# Patient Record
Sex: Female | Born: 1935 | Race: White | Hispanic: No | Marital: Married | Smoking: Never smoker
Health system: Southern US, Community
[De-identification: ages and names within clinical notes are randomized; demographics above are authoritative.]

## PROBLEM LIST (undated history)

## (undated) DIAGNOSIS — Z860100 Personal history of colon polyps, unspecified: Secondary | ICD-10-CM

## (undated) DIAGNOSIS — N189 Chronic kidney disease, unspecified: Secondary | ICD-10-CM

## (undated) DIAGNOSIS — I499 Cardiac arrhythmia, unspecified: Secondary | ICD-10-CM

## (undated) DIAGNOSIS — E785 Hyperlipidemia, unspecified: Secondary | ICD-10-CM

## (undated) DIAGNOSIS — T7840XA Allergy, unspecified, initial encounter: Secondary | ICD-10-CM

## (undated) DIAGNOSIS — K219 Gastro-esophageal reflux disease without esophagitis: Secondary | ICD-10-CM

## (undated) DIAGNOSIS — F0390 Unspecified dementia without behavioral disturbance: Secondary | ICD-10-CM

## (undated) DIAGNOSIS — IMO0002 Reserved for concepts with insufficient information to code with codable children: Secondary | ICD-10-CM

## (undated) HISTORY — DX: Personal history of colonic polyps: Z86.010

## (undated) HISTORY — DX: Allergy, unspecified, initial encounter: T78.40XA

## (undated) HISTORY — PX: CHOLECYSTECTOMY: SHX55

## (undated) HISTORY — DX: Reserved for concepts with insufficient information to code with codable children: IMO0002

## (undated) HISTORY — DX: Hyperlipidemia, unspecified: E78.5

## (undated) HISTORY — DX: Chronic kidney disease, unspecified: N18.9

## (undated) HISTORY — PX: CATARACT EXTRACTION: SUR2

## (undated) HISTORY — DX: Cardiac arrhythmia, unspecified: I49.9

## (undated) HISTORY — DX: Personal history of colon polyps, unspecified: Z86.0100

## (undated) HISTORY — PX: TONSILLECTOMY: SUR1361

## (undated) HISTORY — DX: Gastro-esophageal reflux disease without esophagitis: K21.9

---

## 2001-07-18 ENCOUNTER — Ambulatory Visit (HOSPITAL_COMMUNITY): Admission: RE | Admit: 2001-07-18 | Discharge: 2001-07-18 | Payer: Self-pay | Admitting: Gastroenterology

## 2001-07-18 ENCOUNTER — Encounter (INDEPENDENT_AMBULATORY_CARE_PROVIDER_SITE_OTHER): Payer: Self-pay | Admitting: Specialist

## 2001-09-10 ENCOUNTER — Ambulatory Visit (HOSPITAL_COMMUNITY): Admission: RE | Admit: 2001-09-10 | Discharge: 2001-09-10 | Payer: Self-pay | Admitting: Gastroenterology

## 2004-02-23 ENCOUNTER — Ambulatory Visit: Payer: Self-pay | Admitting: Family Medicine

## 2004-03-01 ENCOUNTER — Ambulatory Visit: Payer: Self-pay | Admitting: Family Medicine

## 2004-05-11 ENCOUNTER — Encounter: Admission: RE | Admit: 2004-05-11 | Discharge: 2004-05-11 | Payer: Self-pay | Admitting: Neurology

## 2004-10-06 ENCOUNTER — Emergency Department (HOSPITAL_COMMUNITY): Admission: EM | Admit: 2004-10-06 | Discharge: 2004-10-06 | Payer: Self-pay | Admitting: Emergency Medicine

## 2005-03-09 ENCOUNTER — Encounter: Admission: RE | Admit: 2005-03-09 | Discharge: 2005-04-10 | Payer: Self-pay | Admitting: Neurology

## 2005-04-11 ENCOUNTER — Encounter: Admission: RE | Admit: 2005-04-11 | Discharge: 2005-07-10 | Payer: Self-pay | Admitting: Neurology

## 2006-03-05 ENCOUNTER — Emergency Department (HOSPITAL_COMMUNITY): Admission: EM | Admit: 2006-03-05 | Discharge: 2006-03-05 | Payer: Self-pay | Admitting: Emergency Medicine

## 2006-03-14 ENCOUNTER — Encounter: Admission: RE | Admit: 2006-03-14 | Discharge: 2006-03-14 | Payer: Self-pay | Admitting: Gastroenterology

## 2006-05-07 ENCOUNTER — Ambulatory Visit (HOSPITAL_COMMUNITY): Admission: RE | Admit: 2006-05-07 | Discharge: 2006-05-08 | Payer: Self-pay | Admitting: Surgery

## 2006-05-07 ENCOUNTER — Encounter (INDEPENDENT_AMBULATORY_CARE_PROVIDER_SITE_OTHER): Payer: Self-pay | Admitting: Specialist

## 2006-11-27 DIAGNOSIS — K219 Gastro-esophageal reflux disease without esophagitis: Secondary | ICD-10-CM | POA: Insufficient documentation

## 2006-11-27 DIAGNOSIS — Z860100 Personal history of colon polyps, unspecified: Secondary | ICD-10-CM | POA: Insufficient documentation

## 2006-11-27 DIAGNOSIS — E785 Hyperlipidemia, unspecified: Secondary | ICD-10-CM

## 2007-10-29 ENCOUNTER — Ambulatory Visit: Payer: Self-pay | Admitting: Family Medicine

## 2007-10-29 ENCOUNTER — Emergency Department (HOSPITAL_COMMUNITY): Admission: EM | Admit: 2007-10-29 | Discharge: 2007-10-29 | Payer: Self-pay | Admitting: Emergency Medicine

## 2007-10-29 DIAGNOSIS — E039 Hypothyroidism, unspecified: Secondary | ICD-10-CM | POA: Insufficient documentation

## 2007-10-29 DIAGNOSIS — G309 Alzheimer's disease, unspecified: Secondary | ICD-10-CM

## 2007-10-29 DIAGNOSIS — F411 Generalized anxiety disorder: Secondary | ICD-10-CM | POA: Insufficient documentation

## 2007-10-29 DIAGNOSIS — F0280 Dementia in other diseases classified elsewhere without behavioral disturbance: Secondary | ICD-10-CM

## 2007-10-30 ENCOUNTER — Encounter: Payer: Self-pay | Admitting: Family Medicine

## 2007-11-25 ENCOUNTER — Ambulatory Visit: Payer: Self-pay | Admitting: Family Medicine

## 2007-11-28 ENCOUNTER — Ambulatory Visit: Payer: Self-pay | Admitting: Family Medicine

## 2007-11-28 DIAGNOSIS — J309 Allergic rhinitis, unspecified: Secondary | ICD-10-CM | POA: Insufficient documentation

## 2007-11-28 LAB — CONVERTED CEMR LAB: Hgb A1c MFr Bld: 7.6 % — ABNORMAL HIGH (ref 4.6–6.0)

## 2008-03-04 ENCOUNTER — Ambulatory Visit: Payer: Self-pay | Admitting: Family Medicine

## 2008-03-08 LAB — CONVERTED CEMR LAB: Hgb A1c MFr Bld: 7.8 % — ABNORMAL HIGH (ref 4.6–6.0)

## 2008-03-11 ENCOUNTER — Ambulatory Visit: Payer: Self-pay | Admitting: Family Medicine

## 2008-07-02 ENCOUNTER — Telehealth: Payer: Self-pay | Admitting: Family Medicine

## 2008-08-12 ENCOUNTER — Emergency Department (HOSPITAL_COMMUNITY): Admission: EM | Admit: 2008-08-12 | Discharge: 2008-08-13 | Payer: Self-pay | Admitting: Emergency Medicine

## 2008-08-19 ENCOUNTER — Ambulatory Visit: Payer: Self-pay | Admitting: Family Medicine

## 2008-08-19 DIAGNOSIS — E119 Type 2 diabetes mellitus without complications: Secondary | ICD-10-CM | POA: Insufficient documentation

## 2008-08-29 ENCOUNTER — Emergency Department (HOSPITAL_COMMUNITY): Admission: EM | Admit: 2008-08-29 | Discharge: 2008-08-29 | Payer: Self-pay | Admitting: Emergency Medicine

## 2008-09-04 LAB — CONVERTED CEMR LAB
ALT: 14 units/L (ref 0–35)
AST: 23 units/L (ref 0–37)
Albumin: 3.8 g/dL (ref 3.5–5.2)
BUN: 19 mg/dL (ref 6–23)
Basophils Relative: 2.8 % (ref 0.0–3.0)
CO2: 28 meq/L (ref 19–32)
Chloride: 108 meq/L (ref 96–112)
Cholesterol: 139 mg/dL (ref 0–200)
Creatinine, Ser: 0.9 mg/dL (ref 0.4–1.2)
Eosinophils Absolute: 0.1 10*3/uL (ref 0.0–0.7)
Eosinophils Relative: 2.6 % (ref 0.0–5.0)
HCT: 36.8 % (ref 36.0–46.0)
Hgb A1c MFr Bld: 8.8 % — ABNORMAL HIGH (ref 4.6–6.5)
Lymphs Abs: 1.7 10*3/uL (ref 0.7–4.0)
MCHC: 34.3 g/dL (ref 30.0–36.0)
MCV: 98.1 fL (ref 78.0–100.0)
Monocytes Absolute: 0.4 10*3/uL (ref 0.1–1.0)
Neutrophils Relative %: 52 % (ref 43.0–77.0)
Platelets: 241 10*3/uL (ref 150.0–400.0)
Potassium: 4 meq/L (ref 3.5–5.1)
Total Bilirubin: 0.5 mg/dL (ref 0.3–1.2)
Triglycerides: 94 mg/dL (ref 0.0–149.0)

## 2008-09-22 ENCOUNTER — Observation Stay (HOSPITAL_COMMUNITY): Admission: EM | Admit: 2008-09-22 | Discharge: 2008-09-22 | Payer: Self-pay | Admitting: Emergency Medicine

## 2008-10-30 ENCOUNTER — Emergency Department (HOSPITAL_COMMUNITY): Admission: EM | Admit: 2008-10-30 | Discharge: 2008-10-30 | Payer: Self-pay | Admitting: Emergency Medicine

## 2008-12-01 ENCOUNTER — Ambulatory Visit: Payer: Self-pay | Admitting: Family Medicine

## 2008-12-12 LAB — CONVERTED CEMR LAB: Hgb A1c MFr Bld: 10 % — ABNORMAL HIGH (ref 4.6–6.5)

## 2009-02-24 ENCOUNTER — Ambulatory Visit: Payer: Self-pay | Admitting: Family Medicine

## 2009-02-24 DIAGNOSIS — D649 Anemia, unspecified: Secondary | ICD-10-CM | POA: Insufficient documentation

## 2009-03-02 LAB — CONVERTED CEMR LAB
BUN: 12 mg/dL (ref 6–23)
Basophils Absolute: 0 10*3/uL (ref 0.0–0.1)
Calcium: 9.9 mg/dL (ref 8.4–10.5)
Chloride: 107 meq/L (ref 96–112)
Cholesterol: 149 mg/dL (ref 0–200)
Creatinine, Ser: 0.9 mg/dL (ref 0.4–1.2)
GFR calc non Af Amer: 63.38 mL/min (ref >60)
LDL Cholesterol: 60 mg/dL (ref 0–99)
Lymphocytes Relative: 39.3 % (ref 12.0–46.0)
Monocytes Relative: 7 % (ref 3.0–12.0)
Platelets: 241 10*3/uL (ref 150.0–400.0)
RDW: 12.8 % (ref 11.5–14.6)
TSH: 1.29 microintl units/mL (ref 0.35–5.50)
Total CHOL/HDL Ratio: 2
Triglycerides: 94 mg/dL (ref 0.0–149.0)

## 2009-06-22 ENCOUNTER — Telehealth: Payer: Self-pay | Admitting: Family Medicine

## 2009-07-07 ENCOUNTER — Encounter: Payer: Self-pay | Admitting: Family Medicine

## 2009-08-31 ENCOUNTER — Telehealth: Payer: Self-pay | Admitting: Family Medicine

## 2009-09-28 ENCOUNTER — Emergency Department (HOSPITAL_COMMUNITY)
Admission: EM | Admit: 2009-09-28 | Discharge: 2009-09-28 | Payer: Self-pay | Source: Home / Self Care | Admitting: Emergency Medicine

## 2009-10-07 ENCOUNTER — Ambulatory Visit: Payer: Self-pay | Admitting: Family Medicine

## 2009-10-11 LAB — CONVERTED CEMR LAB: Hgb A1c MFr Bld: 11.3 % — ABNORMAL HIGH (ref 4.6–6.5)

## 2010-01-06 ENCOUNTER — Emergency Department (HOSPITAL_COMMUNITY)
Admission: EM | Admit: 2010-01-06 | Discharge: 2010-01-07 | Payer: Self-pay | Source: Home / Self Care | Admitting: Emergency Medicine

## 2010-01-14 ENCOUNTER — Telehealth: Payer: Self-pay | Admitting: Family Medicine

## 2010-01-26 ENCOUNTER — Ambulatory Visit: Payer: Self-pay | Admitting: Family Medicine

## 2010-03-29 ENCOUNTER — Telehealth: Payer: Self-pay | Admitting: Family Medicine

## 2010-05-03 ENCOUNTER — Ambulatory Visit
Admission: RE | Admit: 2010-05-03 | Discharge: 2010-05-03 | Payer: Self-pay | Source: Home / Self Care | Attending: Family Medicine | Admitting: Family Medicine

## 2010-05-03 ENCOUNTER — Encounter: Payer: Self-pay | Admitting: Family Medicine

## 2010-05-03 ENCOUNTER — Other Ambulatory Visit: Payer: Self-pay | Admitting: Family Medicine

## 2010-05-03 DIAGNOSIS — E559 Vitamin D deficiency, unspecified: Secondary | ICD-10-CM | POA: Insufficient documentation

## 2010-05-03 LAB — BASIC METABOLIC PANEL
Chloride: 106 mEq/L (ref 96–112)
GFR: 49.61 mL/min — ABNORMAL LOW (ref >60.00)
Potassium: 5.1 mEq/L (ref 3.5–5.1)
Sodium: 147 mEq/L — ABNORMAL HIGH (ref 135–145)

## 2010-05-03 LAB — CONVERTED CEMR LAB
Bilirubin Urine: NEGATIVE
Blood in Urine, dipstick: NEGATIVE
Glucose, Urine, Semiquant: 1
Ketones, urine, test strip: NEGATIVE
Nitrite: NEGATIVE
Protein, U semiquant: NEGATIVE
Specific Gravity, Urine: 1.015
Urobilinogen, UA: 0.2
WBC Urine, dipstick: NEGATIVE
pH: 5.5

## 2010-05-03 LAB — CBC WITH DIFFERENTIAL/PLATELET
Basophils Absolute: 0 10*3/uL (ref 0.0–0.1)
Basophils Relative: 0.7 % (ref 0.0–3.0)
Eosinophils Absolute: 0.2 10*3/uL (ref 0.0–0.7)
Eosinophils Relative: 2.8 % (ref 0.0–5.0)
HCT: 39.9 % (ref 36.0–46.0)
Hemoglobin: 13.6 g/dL (ref 12.0–15.0)
Lymphocytes Relative: 27.4 % (ref 12.0–46.0)
Lymphs Abs: 1.9 10*3/uL (ref 0.7–4.0)
MCHC: 34.1 g/dL (ref 30.0–36.0)
MCV: 100.1 fl — ABNORMAL HIGH (ref 78.0–100.0)
Monocytes Absolute: 0.5 10*3/uL (ref 0.1–1.0)
Monocytes Relative: 6.9 % (ref 3.0–12.0)
Neutro Abs: 4.3 10*3/uL (ref 1.4–7.7)
Neutrophils Relative %: 62.2 % (ref 43.0–77.0)
Platelets: 270 10*3/uL (ref 150.0–400.0)
RBC: 3.99 Mil/uL (ref 3.87–5.11)
RDW: 14.1 % (ref 11.5–14.6)
WBC: 6.9 10*3/uL (ref 4.5–10.5)

## 2010-05-03 LAB — HEPATIC FUNCTION PANEL
ALT: 17 U/L (ref 0–35)
AST: 26 U/L (ref 0–37)
Albumin: 4.1 g/dL (ref 3.5–5.2)
Total Protein: 7.8 g/dL (ref 6.0–8.3)

## 2010-05-03 LAB — MICROALBUMIN / CREATININE URINE RATIO
Creatinine,U: 99.4 mg/dL
Microalb Creat Ratio: 1.1 mg/g (ref 0.0–30.0)

## 2010-05-03 LAB — HEMOGLOBIN A1C: Hgb A1c MFr Bld: 8 % — ABNORMAL HIGH (ref 4.6–6.5)

## 2010-05-03 LAB — LIPID PANEL
Cholesterol: 189 mg/dL (ref 0–200)
VLDL: 29.6 mg/dL (ref 0.0–40.0)

## 2010-05-03 LAB — TSH: TSH: 3.73 u[IU]/mL (ref 0.35–5.50)

## 2010-05-04 LAB — CONVERTED CEMR LAB: Vit D, 25-Hydroxy: 11 ng/mL — ABNORMAL LOW (ref 30–89)

## 2010-05-12 ENCOUNTER — Telehealth: Payer: Self-pay | Admitting: Family Medicine

## 2010-05-12 NOTE — Progress Notes (Signed)
Summary: new rx needed  tranxene  Phone Note From Pharmacy   [facility] Summary of Call: Needs a new rx for Tranxene 7.5mg   three times a day. call [phone].  Initial call taken by: Warnell Forester,  June 22, 2009 12:19 PM  Follow-up for Phone Call        ok x 3  Follow-up by: Pura Spice, RN,  June 22, 2009 12:47 PM    New/Updated Medications: CLORAZEPATE DIPOTASSIUM 7.5 MG  TABS (CLORAZEPATE DIPOTASSIUM) by mouth three times a day Prescriptions: CLORAZEPATE DIPOTASSIUM 7.5 MG  TABS (CLORAZEPATE DIPOTASSIUM) by mouth three times a day  #90 x 3   Entered by:   Pura Spice, RN   Authorized by:   Judithann Sheen MD   Signed by:   Pura Spice, RN on 06/22/2009   Method used:   Telephoned to ...       [facility]  [address] [phone][facility] (retail)       [address]       [place], [place]  [address]       Ph: [phone] or [phone]       Fax: [phone]   RxID:   [id number]

## 2010-05-12 NOTE — Assessment & Plan Note (Signed)
Summary: FOLLOW ON DIABETES/AND HOSP FU/PT FASTING/PER GINA/CJR   Vital Signs:  Patient profile:   77 year old female Weight:      123 pounds O2 Sat:      96 % Temp:     97.6 degrees F Pulse rate:   77 / minute Pulse rhythm:   regular BP sitting:   112 / 80  (left arm)  Vitals Entered By: Pura Spice, RN (October 07, 2009 9:15 AM) CC: post hosp f/u uncontrolled diabetes. spouse states not been taking meds right  FBS this Am 105 Is Patient Diabetic? Yes Did you bring your meter with you today? No   History of Present Illness: This 77 year old white female patient with Alzheimer's and known diabetes mellitus2 has been to the emergency room recently because of hyperglycemia but after talking to her husband found that she has not been taking her medicines is in today to evaluate the status of her diabetes and make a plan to prevent this fluctuation of blood sugars Patient actually is she is okay your seeing her husband go out to eat frequently at [facility] make a plan of treatment better after getting lab studies  Allergies (verified): No Known Drug Allergies  Past History:  Past Medical History: Last updated: 11/27/2006 Allergies PVC's Hx kidney stones GERD Hyperlipidemia Ulcers Allergies Heart Arrhythmia High Cholesterol Colonic polyps, hx of Bladder Infections  Past Surgical History: Last updated: 11/27/2006 Tonsillectomy  Social History: Last updated: 11/27/2006 Retired Married Never Smoked Alcohol use-no Drug use-no  Risk Factors: Smoking Status: never (11/27/2006)  Review of Systems      See HPI  The patient denies anorexia, fever, weight loss, weight gain, vision loss, decreased hearing, hoarseness, chest pain, syncope, dyspnea on exertion, peripheral edema, prolonged cough, headaches, hemoptysis, abdominal pain, melena, hematochezia, severe indigestion/heartburn, hematuria, incontinence, genital sores, muscle weakness, suspicious skin lesions,  transient blindness, difficulty walking, depression, unusual weight change, abnormal bleeding, enlarged lymph nodes, angioedema, breast masses, and testicular masses.    Physical Exam  General:  Well-developed,well-nourished,in no acute distress; alert,appropriate and cooperative throughout examination Lungs:  Normal respiratory effort, chest expands symmetrically. Lungs are clear to auscultation, no crackles or wheezes. Heart:  Normal rate and regular rhythm. S1 and S2 normal without gallop, murmur, click, rub or other extra sounds. Extremities:  No clubbing, cyanosis, edema, or deformity noted with normal full range of motion of all joints.     Impression & Recommendations:  Problem # 1:  DIABETES MELLITUS, TYPE II (ICD-250.00) Assessment Deteriorated  Her updated medication list for this problem includes:    Adult Aspirin Low Strength 81 Mg Tbdp (Aspirin)    Metformin Hcl 1000 Mg Tabs (Metformin hcl) .Marland Kitchen... 1  qd    Actos 45 Mg Tabs (Pioglitazone hcl) .Marland Kitchen... 1 qd    Glyburide 5 Mg Tabs (Glyburide) .Marland Kitchen... 1 tab  per day  Orders: Venipuncture ([address]) TLB-A1C / Hgb A1C (Glycohemoglobin) (83036-A1C)  Problem # 2:  ALZHEIMER'S DISEASE (ICD-331.0) Assessment: Unchanged  Exelon 4.6 mg patch daily  Problem # 3:  GERD (ICD-530.81) Assessment: Improved  Her updated medication list for this problem includes:    Prilosec 20 Mg Cpdr (Omeprazole) ..... Once two times a day for gerd  Problem # 4:  HYPOTHYROIDISM (ICD-244.9) Assessment: Improved  Her updated medication list for this problem includes:    Levoxyl 75 Mcg Tabs (Levothyroxine sodium) ..... By mouth once daily  Problem # 5:  ANXIETY, CHRONIC (ICD-300.00) Assessment: Improved  Her updated medication list for  this problem includes:    Clorazepate Dipotassium 7.5 Mg Tabs (Clorazepate dipotassium) ..... By mouth three times a day    Sertraline Hcl 50 Mg Tabs (Sertraline hcl) .Marland Kitchen... 1 by mouth once daily  Complete Medication  List: 1)  Adult Aspirin Low Strength 81 Mg Tbdp (Aspirin) 2)  Prilosec 20 Mg Cpdr (Omeprazole) .... Once two times a day for gerd 3)  Levoxyl 75 Mcg Tabs (Levothyroxine sodium) .... By mouth once daily 4)  Clorazepate Dipotassium 7.5 Mg Tabs (Clorazepate dipotassium) .... By mouth three times a day 5)  Sertraline Hcl 50 Mg Tabs (Sertraline hcl) .Marland Kitchen.. 1 by mouth once daily 6)  Exelon 4.6 Mg/24hr Pt24 (Rivastigmine) .Marland Kitchen.. 1 patch each day 7)  Metformin Hcl 1000 Mg Tabs (Metformin hcl) .Marland Kitchen.. 1  qd 8)  Simvastatin 40 Mg Tabs (Simvastatin) .Marland Kitchen.. 1 hs 9)  Actos 45 Mg Tabs (Pioglitazone hcl) .Marland Kitchen.. 1 qd 10)  Glyburide 5 Mg Tabs (Glyburide) .Marland Kitchen.. 1 tab  per day  Patient Instructions: 1)  continue regular medications at this time and after obtaining laboratory studies we will decide on medications

## 2010-05-12 NOTE — Progress Notes (Signed)
Summary: rex clorazepate 7.5 mg with 5 refills   Phone Note From Pharmacy   Caller: [facility]  Reason for Call: Needs renewal Summary of Call: refill clorazepate 7.5 mg  Initial call taken by: Pura Spice, RN,  Aug 31, 2009 1:03 PM  Follow-up for Phone Call        ok'd per dr Scotty Court and faxed to [facility]  Follow-up by: Pura Spice, RN,  Aug 31, 2009 1:04 PM    Prescriptions: CLORAZEPATE DIPOTASSIUM 7.5 MG  TABS (CLORAZEPATE DIPOTASSIUM) by mouth three times a day  #90 x 5   Entered by:   Pura Spice, RN   Authorized by:   Judithann Sheen MD   Signed by:   Pura Spice, RN on 08/31/2009   Method used:   Printed then faxed to ...       [facility]  [address] [phone]* (retail)       [address]       [place], [place]  [address]       Ph: [phone] or [phone]       Fax: [phone]   RxID:   [id number]

## 2010-05-12 NOTE — Medication Information (Signed)
Summary: Order for Diabetic Supplies  Order for Diabetic Supplies   Imported By: Maryln Gottron 07/12/2009 11:04:46  _____________________________________________________________________  External Attachment:    Type:   Image     Comment:   External Document

## 2010-05-12 NOTE — Assessment & Plan Note (Signed)
Summary: flu shot//ccm  Nurse Visit   Allergies: No Known Drug Allergies  Orders Added: 1)  Flu Vaccine 55yrs + MEDICARE PATIENTS [Q2039] 2)  Administration Flu vaccine - MCR [G0008] Flu Vaccine Consent Questions     Do you have a history of severe allergic reactions to this vaccine? no    Any prior history of allergic reactions to egg and/or gelatin? no    Do you have a sensitivity to the preservative Thimersol? no    Do you have a past history of Guillan-Barre Syndrome? no    Do you currently have an acute febrile illness? no    Have you ever had a severe reaction to latex? no    Vaccine information given and explained to patient? yes    Are you currently pregnant? no    Lot Number:[id number]   Exp Date:10/08/2010   Site Given  Left Deltoid IM

## 2010-05-12 NOTE — Progress Notes (Signed)
Summary: request for sleep med  Phone Note Call from Patient   Caller: Patient Call For: Judithann Sheen MD Summary of Call: hus is requesting something for sleep to help pt, she has dementia, [facility] [address] [phone]. Pt has ov sch for 04-2010 Initial call taken by: Heron Sabins,  March 29, 2010 1:08 PM  Follow-up for Phone Call        rx called in. Follow-up by: Romualdo Bolk, CMA (AAMA),  April 01, 2010 11:25 AM    New/Updated Medications: TEMAZEPAM 15 MG CAPS (TEMAZEPAM) 1 by mouth at bedtime Prescriptions: TEMAZEPAM 15 MG CAPS (TEMAZEPAM) 1 by mouth at bedtime  #30 x 1   Entered by:   Romualdo Bolk, CMA (AAMA)   Authorized by:   Judithann Sheen MD   Signed by:   Romualdo Bolk, CMA (AAMA) on 04/01/2010   Method used:   Telephoned to ...       [facility]  [address] [phone]* (retail)       [address]       [place], [place]  [address]       Ph: [phone] or [phone]       Fax: [phone]   RxID:   [id number]

## 2010-05-12 NOTE — Telephone Encounter (Signed)
Pts husband called to adv that he was told to start giving his wife aricept but he doesn't know if he is supposed to stop giving his wife the exelon patch or should he give her both.... Would like a return call to [phone] to discuss same.

## 2010-05-12 NOTE — Progress Notes (Signed)
Summary: Glyburide refill  Phone Note Refill Request   Refills Requested: Medication #1:  GLYBURIDE 5 MG TABS 2 tabs per day.   Dosage confirmed as above?Dosage Confirmed Initial call taken by: Josph Macho RMA,  January 14, 2010 3:21 PM    Prescriptions: GLYBURIDE 5 MG TABS (GLYBURIDE) 2 tabs per day  #60 x 6   Entered by:   Josph Macho RMA   Authorized by:   Danise Edge MD   Signed by:   Josph Macho RMA on 01/14/2010   Method used:   Electronically to        [facility]* (mail-order)       [address]       [place], [place]  [address]       Ph: [phone]       Fax: [phone]   RxID:   [id number]

## 2010-05-12 NOTE — Assessment & Plan Note (Signed)
Summary: emp---will fast//ccm/pt rsc from bmp/cjr [facility] BMP/NJR   Vital Signs:  Patient profile:   77 year old female Height:      62 inches Weight:      126 pounds BMI:     23.13 Pulse rate:   82 / minute Pulse rhythm:   regular BP sitting:   120 / 68  (left arm)  Vitals Entered By: Kyung Rudd, CMA (May 03, 2010 8:47 AM) CC: CPX   History of Present Illness: This 77 yr old white married female with Alzheimers he brought in by her husband with her medical problems and discuss her refill. He relates she has been doing very well except for the Alzheimer which may be getting more severe there have discussed with him the possibility of adding Aricept 10 mg each night. Her diabetes is not been doing well CABG he relates has been to 100 or slightly under, to check hemoglobin A1 C. and then discuss further treatment record should mention the patient does not fall and Patient's husband states that Amorie does not sleep well at all and keep him white she does take temazepam 30 mg but is not effective She is taking Prilosec which is controlling her GERD As continued on regular medications  Current Medications (verified): 1)  Prilosec 20 Mg  Cpdr (Omeprazole) .... Once Two Times A Day For Gerd 2)  Levoxyl 75 Mcg  Tabs (Levothyroxine Sodium) .... By Mouth Once Daily 3)  Clorazepate Dipotassium 7.5 Mg  Tabs (Clorazepate Dipotassium) .... By Mouth Three Times A Day 4)  Sertraline Hcl 50 Mg  Tabs (Sertraline Hcl) .Marland Kitchen.. 1 By Mouth Once Daily 5)  Exelon 4.6 Mg/24hr  Pt24 (Rivastigmine) .Marland Kitchen.. 1 Patch Each Day 6)  Metformin Hcl 1000 Mg Tabs (Metformin Hcl) .Marland Kitchen.. 1  Qd 7)  Simvastatin 40 Mg Tabs (Simvastatin) .Marland Kitchen.. 1 Hs 8)  Actos 45 Mg Tabs (Pioglitazone Hcl) .Marland Kitchen.. 1 Qd 9)  Glyburide 5 Mg Tabs (Glyburide) .... 2 Tabs Per Day 10)  Temazepam 15 Mg Caps (Temazepam) .Marland Kitchen.. 1 By Mouth At Bedtime 11)  Aleve 220 Mg Tabs (Naproxen Sodium) .... Once Daily  Allergies (verified): No Known Drug Allergies  Past  History:  Past Medical History: Last updated: 11/27/2006 Allergies PVC's Hx kidney stones GERD Hyperlipidemia Ulcers Allergies Heart Arrhythmia High Cholesterol Colonic polyps, hx of Bladder Infections  Past Surgical History: Last updated: 11/27/2006 Tonsillectomy  Family History: Last updated: 11/27/2006 Family History Diabetes 1st degree relative Family History Kidney disease Family History of Sudden Death  Social History: Last updated: 11/27/2006 Retired Married Never Smoked Alcohol use-no Drug use-no  Social History: Reviewed history from 11/27/2006 and no changes required. Retired Married Never Smoked Alcohol use-no Drug use-no  Review of Systems      See HPI  Physical Exam  General:  Well-developed,well-nourished,in no acute distress; alert,appropriate and cooperative throughout examinationsmiles continuously and he is cooperative Head:  Normocephalic and atraumatic without obvious abnormalities. No apparent alopecia or balding. Eyes:  No corneal or conjunctival inflammation noted. EOMI. Perrla. Funduscopic exam benign, without hemorrhages, exudates or papilledema. Vision grossly normal. Ears:  External ear exam shows no significant lesions or deformities.  Otoscopic examination reveals clear canals, tympanic membranes are intact bilaterally without bulging, retraction, inflammation or discharge. Hearing is grossly normal bilaterally. Nose:  External nasal examination shows no deformity or inflammation. Nasal mucosa are pink and moist without lesions or exudates. Mouth:  Oral mucosa and oropharynx without lesions or exudates.  Teeth in good repair. Neck:  No  deformities, masses, or tenderness noted. Chest Wall:  No deformities, masses, or tenderness noted. Breasts:  No mass, nodules, thickening, tenderness, bulging, retraction, inflamation, nipple discharge or skin changes noted.   Lungs:  Normal respiratory effort, chest expands symmetrically. Lungs are  clear to auscultation, no crackles or wheezes. Heart:  Normal rate and regular rhythm. S1 and S2 normal without gallop, murmur, click, rub or other extra sounds. Abdomen:  Bowel sounds positive,abdomen soft and non-tender without masses, organomegaly or hernias noted. Rectal:  none exam Genitalia:  none exam Msk:  No deformity or scoliosis noted of thoracic or lumbar spine.   Pulses:  R and L carotid,radial,femoral,dorsalis pedis and posterior tibial pulses are full and equal bilaterally Extremities:  No clubbing, cyanosis, edema, or deformity noted with normal full range of motion of all joints.   Neurologic:  No cranial nerve deficits noted. Station and gait are normal. Plantar reflexes are down-going bilaterally. DTRs are symmetrical throughout. Sensory, motor and coordinative functions appear intact. Skin:  Intact without suspicious lesions or rashes Cervical Nodes:  No lymphadenopathy noted Axillary Nodes:  No palpable lymphadenopathy Inguinal Nodes:  No significant adenopathy Psych:  Cognition and judgment appear intact. Alert and cooperative with normal attention span and concentration. No apparent delusions, illusions, hallucinations   Impression & Recommendations:  Problem # 1:  DIABETES MELLITUS, TYPE II (ICD-250.00) Assessment Deteriorated  The following medications were removed from the medication list:    Adult Aspirin Low Strength 81 Mg Tbdp (Aspirin) Her updated medication list for this problem includes:    Metformin Hcl 1000 Mg Tabs (Metformin hcl) .Marland Kitchen... 1  qd    Actos 45 Mg Tabs (Pioglitazone hcl) .Marland Kitchen... 1 qd    Glyburide 5 Mg Tabs (Glyburide) .Marland Kitchen... 2 tabs per day  Orders: Venipuncture (16109) TLB-BMP (Basic Metabolic Panel-BMET) (80048-METABOL) TLB-Microalbumin/Creat Ratio, Urine (82043-MALB) TLB-A1C / Hgb A1C (Glycohemoglobin) (83036-A1C) Specimen Handling ([address])  Problem # 2:  ANXIETY, CHRONIC (ICD-300.00) Assessment: Improved  Her updated medication list for  this problem includes:    Clorazepate Dipotassium 7.5 Mg Tabs (Clorazepate dipotassium) ..... By mouth three times a day    Sertraline Hcl 50 Mg Tabs (Sertraline hcl) .Marland Kitchen... 1 by mouth once daily  Problem # 3:  HYPOTHYROIDISM (ICD-244.9) Assessment: Improved  Her updated medication list for this problem includes:    Levoxyl 75 Mcg Tabs (Levothyroxine sodium) ..... By mouth once daily  Orders: TLB-TSH (Thyroid Stimulating Hormone) (84443-TSH) Specimen Handling (09811)  Problem # 4:  GERD (ICD-530.81) Assessment: Improved  Her updated medication list for this problem includes:    Prilosec 20 Mg Cpdr (Omeprazole) ..... Once two times a day for gerd  Problem # 5:  HYPERLIPIDEMIA (ICD-272.4) Assessment: Improved  Her updated medication list for this problem includes:    Simvastatin 40 Mg Tabs (Simvastatin) .Marland Kitchen... 1 hs  Orders: TLB-Lipid Panel (80061-LIPID) TLB-Hepatic/Liver Function Pnl (80076-HEPATIC) Specimen Handling (91478)  Complete Medication List: 1)  Prilosec 20 Mg Cpdr (Omeprazole) .... Once two times a day for gerd 2)  Levoxyl 75 Mcg Tabs (Levothyroxine sodium) .... By mouth once daily 3)  Clorazepate Dipotassium 7.5 Mg Tabs (Clorazepate dipotassium) .... By mouth three times a day 4)  Sertraline Hcl 50 Mg Tabs (Sertraline hcl) .Marland Kitchen.. 1 by mouth once daily 5)  Exelon 4.6 Mg/24hr Pt24 (Rivastigmine) .Marland Kitchen.. 1 patch each day 6)  Metformin Hcl 1000 Mg Tabs (Metformin hcl) .Marland Kitchen.. 1  qd 7)  Simvastatin 40 Mg Tabs (Simvastatin) .Marland Kitchen.. 1 hs 8)  Actos 45 Mg Tabs (Pioglitazone hcl) .Marland KitchenMarland KitchenMarland Kitchen  1 qd 9)  Glyburide 5 Mg Tabs (Glyburide) .... 2 tabs per day 10)  Aleve 220 Mg Tabs (Naproxen sodium) .... Once daily 11)  Temazepam 30 Mg Caps (Temazepam) .Marland Kitchen.. 1 hs 12)  Aricept 10 Mg Tabs (Donepezil hcl) .Marland Kitchen.. 1 qd  Other Orders: TLB-CBC Platelet - w/Differential (85025-CBCD) T-Vitamin D (25-Hydroxy) (81191-47829) UA Dipstick w/o Micro (automated)  (81003) Prescription Created Electronically  [phone])  Patient Instructions: 1)  2 add Aricept 10 mg q.d. fall from her 2)  Refill medications, continue as prescribed 3)  Will attempt to better control diabetes Prescriptions: ARICEPT 10 MG TABS (DONEPEZIL HCL) 1 qd  #90 x 3   Entered and Authorized by:   Judithann Sheen MD   Signed by:   Judithann Sheen MD on 05/03/2010   Method used:   Electronically to        [facility]* (mail-order)       [address]       [place], [place]  [address]       Ph: [phone]       Fax: [phone]   RxID:   [id number] TEMAZEPAM 30 MG CAPS (TEMAZEPAM) 1 hs  #90 x 1   Entered and Authorized by:   Judithann Sheen MD   Signed by:   Judithann Sheen MD on 05/03/2010   Method used:   Print then Give to Patient   RxID:   [id number] GLYBURIDE 5 MG TABS (GLYBURIDE) 2 tabs per day  #180 x 3   Entered and Authorized by:   Judithann Sheen MD   Signed by:   Judithann Sheen MD on 05/03/2010   Method used:   Faxed to ...       [facility]* (mail-order)       [address]       [place], [place]  [address]       Ph: [phone]       Fax: [phone]   RxID:   [id number] ACTOS 45 MG TABS (PIOGLITAZONE HCL) 1 qd  #90 x 3   Entered and Authorized by:   Judithann Sheen MD   Signed by:   Judithann Sheen MD on 05/03/2010   Method used:   Faxed to ...       [facility]* (mail-order)       [address]       [place], [place]  [address]       Ph: [phone]       Fax: [phone]   RxID:   [id number] SIMVASTATIN 40 MG TABS (SIMVASTATIN) 1 hs  #90 x 3   Entered and Authorized by:   Judithann Sheen MD   Signed by:   Judithann Sheen MD on 05/03/2010   Method used:   Faxed to ...       [facility]* (mail-order)       [address]       [place], [place]  [address]       Ph: [phone]       Fax: [phone]   RxID:   [id number] METFORMIN HCL 1000 MG  TABS (METFORMIN HCL) 1  qd  #90 x 3   Entered and Authorized by:   Judithann Sheen MD   Signed by:   Judithann Sheen MD on 05/03/2010   Method used:  Faxed to ...       [facility]* (mail-order)       [address]       [place], [place]  [address]       Ph: [phone]       Fax: [phone]   RxID:   [id number] EXELON 4.6 MG/24HR  PT24 (RIVASTIGMINE) 1 patch each day  #90 x 3   Entered and Authorized by:   Judithann Sheen MD   Signed by:   Judithann Sheen MD on 05/03/2010   Method used:   Faxed to ...       [facility]* (mail-order)       [address]       [place], [place]  [address]       Ph: [phone]       Fax: [phone]   RxID:   [id number] SERTRALINE HCL 50 MG  TABS (SERTRALINE HCL) 1 by mouth once daily  #90 x 3   Entered and Authorized by:   Judithann Sheen MD   Signed by:   Judithann Sheen MD on 05/03/2010   Method used:   Faxed to ...       [facility]* (mail-order)       [address]       [place], [place]  [address]       Ph: [phone]       Fax: [phone]   RxID:   [id number] LEVOXYL 75 MCG  TABS (LEVOTHYROXINE SODIUM) by mouth once daily  #90 x 3   Entered and Authorized by:   Judithann Sheen MD   Signed by:   Judithann Sheen MD on 05/03/2010   Method used:   Electronically to        [facility]* (mail-order)       [address]       [place], [place]  [address]       Ph: [phone]       Fax: [phone]   RxID:   [id number] PRILOSEC 20 MG  CPDR (OMEPRAZOLE) once two times a day for gerd  #180 x 3   Entered and Authorized by:   Judithann Sheen MD   Signed by:   Judithann Sheen MD on 05/03/2010   Method used:   Electronically to        [facility]* (mail-order)       [address]       [place], [place]  [address]       Ph: [phone]       Fax: [phone]   RxID:    [id number]    Orders Added: 1)  Venipuncture [33825] 2)  TLB-Lipid Panel [80061-LIPID] 3)  TLB-BMP (Basic Metabolic Panel-BMET) [80048-METABOL] 4)  TLB-CBC Platelet - w/Differential [85025-CBCD] 5)  TLB-Hepatic/Liver Function Pnl [80076-HEPATIC] 6)  TLB-TSH (Thyroid Stimulating Hormone) [84443-TSH] 7)  T-Vitamin D (25-Hydroxy) [05397-67[address]] 8)  TLB-Microalbumin/Creat Ratio, Urine [82043-MALB] 9)  UA Dipstick w/o Micro (automated)  [81003] 10)  TLB-A1C / Hgb A1C (Glycohemoglobin) [83036-A1C] 11)  Specimen Handling [99000] 12)  Prescription Created Electronically [G8553] 13)  Est. Patient Level IV [93790]    Laboratory Results   Urine Tests  Date/Time Recieved: May 03, 2010 11:35 AM  Date/Time Reported: May 03, 2010 11:35 AM   Routine Urinalysis   Color: yellow Appearance: Clear Glucose: 1=   (Normal  Range: Negative) Bilirubin: negative   (Normal Range: Negative) Ketone: negative   (Normal Range: Negative) Spec. Gravity: 1.015   (Normal Range: 1.003-1.035) Blood: negative   (Normal Range: Negative) pH: 5.5   (Normal Range: 5.0-8.0) Protein: negative   (Normal Range: Negative) Urobilinogen: 0.2   (Normal Range: 0-1) Nitrite: negative   (Normal Range: Negative) Leukocyte Esterace: negative   (Normal Range: Negative)    Comments: Wynona Canes, CMA  May 03, 2010 11:35 AM

## 2010-05-13 NOTE — Telephone Encounter (Signed)
Instructed to exelon and aricept

## 2010-05-21 ENCOUNTER — Emergency Department (HOSPITAL_COMMUNITY): Payer: Medicare Other

## 2010-05-21 ENCOUNTER — Emergency Department (HOSPITAL_COMMUNITY)
Admission: EM | Admit: 2010-05-21 | Discharge: 2010-05-22 | Disposition: A | Discharge status: Home / Self Care | Payer: Medicare Other | Attending: Emergency Medicine | Admitting: Emergency Medicine

## 2010-05-21 DIAGNOSIS — J40 Bronchitis, not specified as acute or chronic: Secondary | ICD-10-CM | POA: Insufficient documentation

## 2010-05-21 DIAGNOSIS — E119 Type 2 diabetes mellitus without complications: Secondary | ICD-10-CM | POA: Insufficient documentation

## 2010-05-21 DIAGNOSIS — R059 Cough, unspecified: Secondary | ICD-10-CM | POA: Insufficient documentation

## 2010-05-21 DIAGNOSIS — R05 Cough: Secondary | ICD-10-CM | POA: Insufficient documentation

## 2010-05-21 DIAGNOSIS — E039 Hypothyroidism, unspecified: Secondary | ICD-10-CM | POA: Insufficient documentation

## 2010-05-21 DIAGNOSIS — F0280 Dementia in other diseases classified elsewhere without behavioral disturbance: Secondary | ICD-10-CM | POA: Insufficient documentation

## 2010-05-21 DIAGNOSIS — R5381 Other malaise: Secondary | ICD-10-CM | POA: Insufficient documentation

## 2010-05-21 DIAGNOSIS — G309 Alzheimer's disease, unspecified: Secondary | ICD-10-CM | POA: Insufficient documentation

## 2010-05-21 DIAGNOSIS — R110 Nausea: Secondary | ICD-10-CM | POA: Insufficient documentation

## 2010-05-21 LAB — COMPREHENSIVE METABOLIC PANEL
AST: 33 U/L (ref 0–37)
BUN: 21 mg/dL (ref 6–23)
CO2: 27 mEq/L (ref 19–32)
Calcium: 9.7 mg/dL (ref 8.4–10.5)
Creatinine, Ser: 0.95 mg/dL (ref 0.4–1.2)
GFR calc Af Amer: >60 mL/min (ref >60)
GFR calc non Af Amer: 56 mL/min — ABNORMAL LOW (ref >60)

## 2010-05-21 LAB — URINE MICROSCOPIC-ADD ON

## 2010-05-21 LAB — POCT CARDIAC MARKERS: Troponin i, poc: <0.05 ng/mL (ref 0.00–0.09)

## 2010-05-21 LAB — URINALYSIS, ROUTINE W REFLEX MICROSCOPIC
Nitrite: NEGATIVE
Specific Gravity, Urine: 1.022 (ref 1.005–1.030)
Urine Glucose, Fasting: 500 mg/dL — AB
pH: 5.5 (ref 5.0–8.0)

## 2010-05-22 LAB — DIFFERENTIAL
Basophils Relative: 0 % (ref 0–1)
Eosinophils Absolute: 0.1 10*3/uL (ref 0.0–0.7)
Eosinophils Relative: 3 % (ref 0–5)
Lymphs Abs: 1.3 10*3/uL (ref 0.7–4.0)
Monocytes Absolute: 0.4 10*3/uL (ref 0.1–1.0)
Monocytes Relative: 8 % (ref 3–12)

## 2010-05-22 LAB — CBC
MCH: 34 pg (ref 26.0–34.0)
MCHC: 35.5 g/dL (ref 30.0–36.0)
MCV: 95.8 fL (ref 78.0–100.0)
Platelets: 242 10*3/uL (ref 150–400)
RDW: 12.9 % (ref 11.5–15.5)

## 2010-05-23 LAB — URINE CULTURE: Colony Count: NO GROWTH

## 2010-06-22 ENCOUNTER — Other Ambulatory Visit: Payer: Self-pay

## 2010-06-22 MED ORDER — CLORAZEPATE DIPOTASSIUM 7.5 MG PO TABS: 7.5000 mg | ORAL_TABLET | Freq: Two times a day (BID) | ORAL | Status: AC | PRN

## 2010-06-23 LAB — DIFFERENTIAL
Basophils Relative: 0 % (ref 0–1)
Eosinophils Absolute: 0.2 10*3/uL (ref 0.0–0.7)
Lymphs Abs: 1.5 10*3/uL (ref 0.7–4.0)
Neutrophils Relative %: 53 % (ref 43–77)

## 2010-06-23 LAB — CBC
MCH: 32.7 pg (ref 26.0–34.0)
MCHC: 33 g/dL (ref 30.0–36.0)
Platelets: 238 10*3/uL (ref 150–400)
RBC: 3.49 MIL/uL — ABNORMAL LOW (ref 3.87–5.11)

## 2010-06-23 LAB — GLUCOSE, CAPILLARY: Glucose-Capillary: 439 mg/dL — ABNORMAL HIGH (ref 70–99)

## 2010-06-23 LAB — BASIC METABOLIC PANEL
CO2: 26 mEq/L (ref 19–32)
Calcium: 8.8 mg/dL (ref 8.4–10.5)
Creatinine, Ser: 0.83 mg/dL (ref 0.4–1.2)
GFR calc Af Amer: >60 mL/min (ref >60)
GFR calc non Af Amer: >60 mL/min (ref >60)

## 2010-06-23 LAB — POCT CARDIAC MARKERS: Myoglobin, poc: 47.2 ng/mL (ref 12–200)

## 2010-06-26 LAB — URINE CULTURE

## 2010-06-26 LAB — COMPREHENSIVE METABOLIC PANEL
BUN: 16 mg/dL (ref 6–23)
Calcium: 9 mg/dL (ref 8.4–10.5)
Glucose, Bld: 349 mg/dL — ABNORMAL HIGH (ref 70–99)
Total Protein: 7.4 g/dL (ref 6.0–8.3)

## 2010-06-26 LAB — URINALYSIS, ROUTINE W REFLEX MICROSCOPIC
Hgb urine dipstick: NEGATIVE
Protein, ur: NEGATIVE mg/dL
Urobilinogen, UA: 0.2 mg/dL (ref 0.0–1.0)

## 2010-06-26 LAB — POCT CARDIAC MARKERS
CKMB, poc: <1 ng/mL — ABNORMAL LOW (ref 1.0–8.0)
Myoglobin, poc: 66.5 ng/mL (ref 12–200)

## 2010-06-26 LAB — DIFFERENTIAL
Basophils Relative: 0 % (ref 0–1)
Lymphs Abs: 1.8 10*3/uL (ref 0.7–4.0)
Monocytes Relative: 6 % (ref 3–12)
Neutro Abs: 3.6 10*3/uL (ref 1.7–7.7)
Neutrophils Relative %: 62 % (ref 43–77)

## 2010-06-26 LAB — GLUCOSE, CAPILLARY: Glucose-Capillary: 205 mg/dL — ABNORMAL HIGH (ref 70–99)

## 2010-06-26 LAB — CBC
HCT: 38.6 % (ref 36.0–46.0)
Hemoglobin: 13.3 g/dL (ref 12.0–15.0)
MCHC: 34.3 g/dL (ref 30.0–36.0)
RDW: 13 % (ref 11.5–15.5)

## 2010-06-26 LAB — URINE MICROSCOPIC-ADD ON

## 2010-07-05 ENCOUNTER — Ambulatory Visit (INDEPENDENT_AMBULATORY_CARE_PROVIDER_SITE_OTHER): Payer: Medicare Other | Admitting: Family Medicine

## 2010-07-05 ENCOUNTER — Encounter: Payer: Self-pay | Admitting: Family Medicine

## 2010-07-05 VITALS — BP 128/80 | HR 65 | Ht 62.0 in | Wt 126.0 lb

## 2010-07-05 DIAGNOSIS — E119 Type 2 diabetes mellitus without complications: Secondary | ICD-10-CM

## 2010-07-05 DIAGNOSIS — K219 Gastro-esophageal reflux disease without esophagitis: Secondary | ICD-10-CM

## 2010-07-05 DIAGNOSIS — F0280 Dementia in other diseases classified elsewhere without behavioral disturbance: Secondary | ICD-10-CM

## 2010-07-05 DIAGNOSIS — S8000XA Contusion of unspecified knee, initial encounter: Secondary | ICD-10-CM

## 2010-07-05 DIAGNOSIS — S8002XA Contusion of left knee, initial encounter: Secondary | ICD-10-CM

## 2010-07-05 MED ORDER — DONEPEZIL HCL 23 MG PO TABS: 23.0000 mg | ORAL_TABLET | Freq: Every day | ORAL | Status: DC

## 2010-07-05 NOTE — Progress Notes (Signed)
  Subjective:    Patient ID: Colleen Collins, female    DOB: January 23, 1923, 77 y.o.   MRN: [id number] This 77 yr old white female with Alzheimers dx fell on leftknee 1 month ago nd then 2 days ago. Began having some discoft when walking but able to bend knee and continue to walk Discussed increasing aricept to 23 mg qd CBGs 110 - 120, diabetes doing well HPI    Review of Systems neg review of systems, no other problems     Objective:   Physical Exam The pt is a well develop, well nourished female who has memory problems Heart normal sie, regular rhythm, no murmurs LUNGS CLEAR Rt knee normal, left knee minimally swollen tenderness under patella, full range of  Motion No edema        Assessment & Plan:  Has  contussed  Knee Plan of tx ace bandage, aleve 250mg  bid, hot packs 20-30 minutes tid Alzheimers no improvement, to increase aricept to 23mg  qd Diabetes mellitis under good control, no change in tx Anxiety under control gerd under control

## 2010-07-05 NOTE — Patient Instructions (Addendum)
Acute contusion of left knee Aleve 250 mg twice daily after meals, use ace bandage when up on knee Heating pad to knee 20-30 minutes 2-3 times per day Increase aricept 23mg  each day

## 2010-07-06 NOTE — Progress Notes (Signed)
Pharmacy did receive the rx via escribe

## 2010-07-18 LAB — CBC
RBC: 3.36 MIL/uL — ABNORMAL LOW (ref 3.87–5.11)
WBC: 4.2 10*3/uL (ref 4.0–10.5)

## 2010-07-18 LAB — COMPREHENSIVE METABOLIC PANEL
ALT: 13 U/L (ref 0–35)
AST: 21 U/L (ref 0–37)
CO2: 26 mEq/L (ref 19–32)
Calcium: 9 mg/dL (ref 8.4–10.5)
Chloride: 104 mEq/L (ref 96–112)
GFR calc Af Amer: 59 mL/min — ABNORMAL LOW (ref >60)
GFR calc non Af Amer: 48 mL/min — ABNORMAL LOW (ref >60)
Sodium: 137 mEq/L (ref 135–145)
Total Bilirubin: 0.4 mg/dL (ref 0.3–1.2)

## 2010-07-18 LAB — DIFFERENTIAL
Basophils Absolute: 0 10*3/uL (ref 0.0–0.1)
Basophils Relative: 1 % (ref 0–1)
Eosinophils Absolute: 0.1 10*3/uL (ref 0.0–0.7)
Eosinophils Relative: 2 % (ref 0–5)
Monocytes Absolute: 0.4 10*3/uL (ref 0.1–1.0)

## 2010-07-18 LAB — URINALYSIS, ROUTINE W REFLEX MICROSCOPIC
Bilirubin Urine: NEGATIVE
Ketones, ur: NEGATIVE mg/dL
Nitrite: NEGATIVE
Urobilinogen, UA: 0.2 mg/dL (ref 0.0–1.0)

## 2010-07-18 LAB — GLUCOSE, CAPILLARY: Glucose-Capillary: 157 mg/dL — ABNORMAL HIGH (ref 70–99)

## 2010-07-19 LAB — DIFFERENTIAL
Eosinophils Relative: 3 % (ref 0–5)
Lymphocytes Relative: 27 % (ref 12–46)
Lymphs Abs: 1.1 10*3/uL (ref 0.7–4.0)
Neutro Abs: 2.5 10*3/uL (ref 1.7–7.7)

## 2010-07-19 LAB — POCT I-STAT, CHEM 8
BUN: 22 mg/dL (ref 6–23)
Chloride: 102 mEq/L (ref 96–112)
HCT: 39 % (ref 36.0–46.0)
Sodium: 136 mEq/L (ref 135–145)
TCO2: 23 mmol/L (ref 0–100)

## 2010-07-19 LAB — URINALYSIS, ROUTINE W REFLEX MICROSCOPIC
Bilirubin Urine: NEGATIVE
Glucose, UA: >1000 mg/dL — AB
Hgb urine dipstick: NEGATIVE
Ketones, ur: NEGATIVE mg/dL
Leukocytes, UA: NEGATIVE
Protein, ur: NEGATIVE mg/dL
Specific Gravity, Urine: 1.022 (ref 1.005–1.030)
Urobilinogen, UA: 0.2 mg/dL (ref 0.0–1.0)
pH: 5.5 (ref 5.0–8.0)

## 2010-07-19 LAB — GLUCOSE, CAPILLARY: Glucose-Capillary: 253 mg/dL — ABNORMAL HIGH (ref 70–99)

## 2010-07-19 LAB — CBC
HCT: 34.7 % — ABNORMAL LOW (ref 36.0–46.0)
Platelets: 236 10*3/uL (ref 150–400)
WBC: 4 10*3/uL (ref 4.0–10.5)

## 2010-07-19 LAB — BASIC METABOLIC PANEL
BUN: 18 mg/dL (ref 6–23)
GFR calc non Af Amer: >60 mL/min (ref >60)
Potassium: 4.9 mEq/L (ref 3.5–5.1)
Sodium: 137 mEq/L (ref 135–145)

## 2010-07-19 LAB — URINE MICROSCOPIC-ADD ON

## 2010-08-26 NOTE — Procedures (Signed)
[facility]  Patient:    EBBA, GOLL Visit Number: [id number] MRN: [id number]          Service Type: END Location: ENDO Attending Physician:  Nelda Marseille Dictated by:   Petra Kuba, M.D. Proc. Date: 09/10/01 Admit Date:  09/10/2001 Discharge Date: 09/10/2001   CC:         Tinnie Gens C. Quintella Reichert, M.D.   Procedure Report  PROCEDURE:  Esophagogastroduodenoscopy.  INDICATIONS FOR PROCEDURE:  Nausea and vomiting in a patient with a duodenal stricture.  Consent was signed after risks, benefits, methods, and options were thoroughly discussed in the past in the office.  MEDICINES USED:  Demerol 60, Versed 7.  DESCRIPTION OF PROCEDURE:  The video endoscope was inserted by direct vision. The esophagus was normal. There might have been tiny hiatal hernia. The scope passed into the stomach and advanced through a normal pylorus. The duodenal bulb had a very shallow bulb ulcer and in the C loop in the customary spot of her previous endoscopy was a benign fibrous ring. We could not advance the scope past this despite some mild to moderate pressure. We could see a second portion of the duodenum in the distance. The scope was slowly withdrawn back to the stomach which was evaluated on retroflexed and then straight visualization. Other than some minimal antritis, no abnormality was seen. The scope was slowly withdrawn through the normal esophagus and we went ahead and inserted the 160 scope which unfortunately no additional findings were seen and we could not advance this through the ring either. The scope was withdrawn, air was suctioned, scope removed. The patient tolerated the procedure well and there was no obvious or immediate complication.  ENDOSCOPIC DIAGNOSIS: 1. Tiny hiatal hernia. 2. Minimal antritis. 3. Very shallow early bulb ulcer. 4. C loop stricture unchanged, unable to pass the regular or the 160 scope. 5. Otherwise within normal limits  seeing the distal duodenum in the distance.  PLAN:  No aspirin or nonsteroidals for two months. Long-term pump inhibitors. Call p.r.n. Slowly advance diet. Chew food well and follow-up in two months to recheck symptoms and make sure no further workup plans are needed. Possibly a balloon dilatation in this area at some point in the future and she would probably do better on COX inhibitors in the future as well if arthritis medications are needed but would continue pump inhibitors with them as well. Dictated by:   Petra Kuba, M.D. Attending Physician:  Nelda Marseille DD:  09/10/01 TD:  09/11/01 Job: [id number]

## 2010-08-26 NOTE — Procedures (Signed)
[facility]  Patient:    Colleen Collins, Colleen Collins Visit Number: [id number] MRN: [id number]          Service Type: END Location: ENDO Attending Physician:  Nelda Marseille Dictated by:   Petra Kuba, M.D. Proc. Date: 07/18/01 Admit Date:  07/18/2001   CC:         Tinnie Gens C. Quintella Reichert, M.D.   Procedure Report  PROCEDURE:  Esophagogastroduodenoscopy with biopsy.  SURGEON:  Petra Kuba, M.D.  INDICATIONS:  Upper tract symptoms.  Consent was signed after risks, benefits, methods, and options were thoroughly discussed prior to any premedications given.  ADDITIONAL MEDICINES FOR THIS PROCEDURE:  Demerol 20 and Versed 2.  DESCRIPTION OF PROCEDURE:  The video endoscope was inserted by direct vision. The esophagus was essentially normal.  There were no signs of significant reflux esophagitis.  No Barretts, possibly a tiny hiatal hernia was confirmed.  The scope was inserted into the stomach where some mild antritis and gastritis were seen and advanced through a slightly deformed pylorus into a slightly inflamed duodenal bulb.  We could not advance around the cilia due to a fibrous cilia stricture, moderately narrowed.  No obvious ulceration was seen.  The scope was slowly withdrawn.  We did try to advance a few times. Back in the stomach, the scope was retroflexed.  The cardia, fundus, angularis, lesser, and greater curve were normal except for the mild gastritis.  The scope was straightened and straight visualization of the stomach did not reveal any additional findings.  I went ahead and took two biopsies of the antrum and two of the proximal stomach to rule out Helicobacter.  Air was suctioned and the scope was slowly withdrawn.  Again, a good look at the esophagus was normal.  The scope was removed.  The patient tolerated the procedure well.  There was no obvious or immediate complication.  ENDOSCOPIC DIAGNOSES: 1. Questionable tiny hiatal hernia. 2.  Mild antritis and gastritis, status post biopsy. 3. Deformed pylorus, probably from previous ulcer disease. 4. Mild bulbitis. 5. Cilia stricture, fibrous and smooth, probably from previous ulcer disease,    unable to advance the scope.  PLAN:  Would probably continue on pump inhibitors and Prilosec.  Consider a small bowel follow through to evaluate the small bowel better if needed. Followup p.r.n. or in two to three months.  Care with aspirin and nonsteroidals based on signs of previous ulcer disease.  If Helicobacter positive, would treat and await pathology as above. Dictated by:   Petra Kuba, M.D. Attending Physician:  Nelda Marseille DD:  07/18/01 TD:  07/19/01 Job: [phone]

## 2010-08-26 NOTE — Op Note (Signed)
NAMEARIANNI, Colleen Collins                  ACCOUNT NO.:  [id number]   MEDICAL RECORD NO.:  [id number]          PATIENT TYPE:  OIB   LOCATION:  5735                         FACILITY:  [facility]   PHYSICIAN:  Sandria Bales. Ezzard Standing, M.D.  DATE OF BIRTH:  [birth year]   DATE OF PROCEDURE:  05/07/2006  DATE OF DISCHARGE:                               OPERATIVE REPORT   PREOPERATIVE DIAGNOSIS:  Chronic cholecystitis and cholelithiasis.   POSTOPERATIVE DIAGNOSIS:  Severe chronic cholecystitis with  cholelithiasis.   PROCEDURE:  Laparoscopic cholecystectomy with intraoperative  cholangiogram.   SURGEON:  Sandria Bales. Ezzard Standing, M.D.   FIRST ASSISTANT:  Sharlet Salina T. Hoxworth, M.D.   ANESTHESIA:  General endotracheal.   ESTIMATED BLOOD LOSS:  Minimal.   INDICATIONS FOR PROCEDURE:  Colleen Collins is an 77 year old white female,  who is a patient of Dr. Archie Endo, who has been seen by Dr. Vida Rigger  before rule out GI complaints.  She has had some vague right upper  quadrant abdominal pain.  She had an ultrasound, which shows multiple  gallstones, which completely fill her gallbladder, and she now comes for  attempted laparoscopic cholecystectomy.   The indications and potential complications of gallbladder surgery were  explained to the patient.  The potential complications include but are  not limited to bleeding, infection, bile duct injury, and the  possibility of open surgery.   OPERATIVE NOTE:  The patient was placed in a supine position and given a  general endotracheal anesthetic.  Her abdomen was prepped with Betadine  solution.  She had 1 g of Ancef at the initiation of the procedure.  There was a little difficulty intubating her, and Dr. Ivin Booty had to  intubate her over a stylet, and I think that was more because her neck  was just stiff.   The abdomen was prepped with Betadine and sterilely draped.  An  infraumbilical incision was made and sharp dissection carried down to  the peritoneal cavity.  A  0-degree, 10-mm laparoscope was inserted  through a 12-mm Hasson trocar.  The Hasson trocar was secured with a 0  Vicryl suture, an abdominal exploration carried out.  The right and left  lobes of the liver were unremarkable.  The stomach had some air in it,  but was unremarkable.  The duodenum that I could see looked  unremarkable, and the remainder of her bowel looked unremarkable.   I placed 3 additional trocars, a 10-mm subxiphoid trocar, a 5-mm right  midsubcostal, and a 5-mm right lateral subcostal trocar.  I then grasped  the gallbladder and rotated it cephalad.  The gallbladder was noted to  have, really, evidence of severe chronic cholecystitis.  It was  basically 1 solid rock with a thickened, fibrous capsule/sheath over the  gallbladder.   I dissected out and identified the cystic artery and cystic duct.  I  placed a clip on the gallbladder side of the cystic duct and shot an  intraoperative cholangiogram.   The intraoperative cholangiogram was shot using half-strength Renografin  injected through a cutoff Taut catheter.  The Taut catheter was  inserted  through the side of the cholecystic duct and secured with an Endoclip.  The cholangiogram showed free flow of contrast down the cystic duct into  the common bile duct and into the duodenum and out the hepatic radicals,  and it was felt to be a normal intraoperative cholangiogram.   After the cholangiogram was done, the Taut catheter was then removed and  the cystic duct triply endoclipped and divided.  The cystic artery was  triply endoclipped and divided.  The gallbladder was then bluntly and  sharply dissected from the gallbladder bed.  Prior to complete division  of the gallbladder from the gallbladder bed, I revisualized the triangle  of Calot.  I revisualized the gallbladder bed.  There was no bleeding,  no bile leak.  The gallbladder was then removed from the gallbladder  fossa and placed in an EndoCatch bag and  delivered through the  umbilicus.   The abdomen was irrigated with about 500 mL of saline.  The gallbladder  which was removed was packed full of 1.2 to 1.4-cm faceted stones.  I  gave to the patient 3 of those stones to take home.   The umbilical port was then closed with a 0 Vicryl suture after the  trocar had been removed.  The other trocars were examined under direct  visualization, and those trocars were removed in turn also.   The skin at each site was closed with interrupted, running 5-0 Vicryl  suture and painted with tincture of benzoin and Steri-Strips.  The  patient tolerated the procedure well and was transported to the recovery  room in good condition.  Sponge and needle counts were correct at the  end of the case.      Sandria Bales. Ezzard Standing, M.D.  Electronically Signed     DHN/MEDQ  D:  05/07/2006  T:  05/07/2006  Job:  [id number]   cc:   Petra Kuba, M.D.  Dario Guardian, M.D.  Lorne Skeens. Hoxworth, M.D.

## 2010-08-26 NOTE — Procedures (Signed)
[facility]  Patient:    Colleen Collins, Colleen Collins Visit Number: [id number] MRN: [id number]          Service Type: END Location: ENDO Attending Physician:  Nelda Marseille Dictated by:   Petra Kuba, M.D. Proc. Date: 07/18/01 Admit Date:  07/18/2001   CC:         Tinnie Gens C. Quintella Reichert, M.D.   Procedure Report  PROCEDURE:  Colonoscopy with polypectomy.  INDICATION:  Patient with longstanding irritable bowel syndrome, well overdue for colonic screening.  She has also had bright red blood per rectum. Consent was signed after risks, benefits, methods, and options thoroughly discussed in the office.  MEDICATIONS:  Demerol 50, Versed 5.  DESCRIPTION OF PROCEDURE:  Rectal inspection was pertinent for small external hemorrhoids.  Digital exam was negative.  The video pediatric adjustable colonoscope was inserted and fairly easily advanced around to the colon to the cecum.  On insertion, small hemorrhagic distal sigmoid polyp was seen and also some left-sided diverticula.  No other abnormalities as we slowly advanced to the cecum which was identified by the appendiceal orifice and the ileocecal valve.  In fact, the scope was inserted a short ways into the terminal ileum which was normal.  Photodocumentation was obtained.  The ileocecal valve had a polypoid, lipomatous-looking, yellowish hue polyp attached to it.  Advancing the cold biopsy forceps, had the customary pillow sign, and three biopsies were obtained with some yellow material extruding with the biopsies, all compatible with lipoma.  These were put in the first container.  In the second container, two tiny cecal polyps were seen and were cold biopsied x 1 or 2 each, and another tiny-to-small questionable ascending polyp was seen and was cold biopsied x 2 and put in the second container as well.  The scope was further withdrawn.  No other abnormalities were as we slowly withdrew through the sigmoid.  There  were some left-sided diverticula.  In the mid sigmoid, a 3 mm polyp was seen and was hot biopsied x 2 and put in the third container, and then the scope was withdrawn back to the distal sigmoid where the polyps seen on insertion was found, snared, electrocautery applied.  The polyp was suctioned through the scope and collected in the trap and put in the third container.  The scope was slowly withdrawn back to the rectum and retroflexed, pertinent for some small internal hemorrhoids.  The scope was straightened; air was suctioned and the scope removed.  The patient tolerated the procedure well.  There was no obvious immediate complication.  ENDOSCOPIC DIAGNOSES: 1. Internal and external hemorrhoids. 2. Left-sided diverticula. 3. Two sigmoid polyps, small, distal sigmoid snared, mid sigmoid hot biopsied    x 2. 4. Ascending and cecal tiny polyps cold biopsied. 5. Fatty ileocecal valve, doubt polyps, status post cold biopsied. 6. Otherwise within normal limits to the terminal ileum.  PLAN: 1. Await pathology to determine future colonic screening. 2. Continue work-up with an EGD.  Please see that dictation for further    work-up and plans and recommendations. Dictated by:   Petra Kuba, M.D. Attending Physician:  Nelda Marseille DD:  07/18/01 TD:  07/19/01 Job: [phone]

## 2010-09-09 ENCOUNTER — Telehealth: Payer: Self-pay | Admitting: Family Medicine

## 2010-09-09 NOTE — Telephone Encounter (Signed)
Ok with me 

## 2010-09-09 NOTE — Telephone Encounter (Signed)
Ok per Dr. Scotty Court to switch PCP to Dr. Caryl Never

## 2010-09-09 NOTE — Telephone Encounter (Signed)
Pt req to change pcps from Dr Scotty Court to Dr Caryl Never, due to availability. Pls advise if ok?

## 2010-09-13 NOTE — Telephone Encounter (Signed)
Lft vm for pt to cb re: approved pcp change.

## 2010-09-14 NOTE — Telephone Encounter (Signed)
Pt called back and is aware of pcp change.

## 2010-09-19 ENCOUNTER — Emergency Department (HOSPITAL_COMMUNITY)
Admission: EM | Admit: 2010-09-19 | Discharge: 2010-09-20 | Disposition: A | Discharge status: Home / Self Care | Payer: Medicare Other | Attending: Emergency Medicine | Admitting: Emergency Medicine

## 2010-09-19 DIAGNOSIS — F0280 Dementia in other diseases classified elsewhere without behavioral disturbance: Secondary | ICD-10-CM | POA: Insufficient documentation

## 2010-09-19 DIAGNOSIS — F3289 Other specified depressive episodes: Secondary | ICD-10-CM | POA: Insufficient documentation

## 2010-09-19 DIAGNOSIS — K219 Gastro-esophageal reflux disease without esophagitis: Secondary | ICD-10-CM | POA: Insufficient documentation

## 2010-09-19 DIAGNOSIS — F329 Major depressive disorder, single episode, unspecified: Secondary | ICD-10-CM | POA: Insufficient documentation

## 2010-09-19 DIAGNOSIS — G309 Alzheimer's disease, unspecified: Secondary | ICD-10-CM | POA: Insufficient documentation

## 2010-09-19 DIAGNOSIS — E119 Type 2 diabetes mellitus without complications: Secondary | ICD-10-CM | POA: Insufficient documentation

## 2010-09-19 DIAGNOSIS — E039 Hypothyroidism, unspecified: Secondary | ICD-10-CM | POA: Insufficient documentation

## 2010-09-19 LAB — DIFFERENTIAL
Basophils Relative: 0 % (ref 0–1)
Eosinophils Absolute: 0 10*3/uL (ref 0.0–0.7)
Eosinophils Relative: 0 % (ref 0–5)
Monocytes Relative: 3 % (ref 3–12)
Neutrophils Relative %: 88 % — ABNORMAL HIGH (ref 43–77)

## 2010-09-19 LAB — CBC
HCT: 39.4 % (ref 36.0–46.0)
Hemoglobin: 13.7 g/dL (ref 12.0–15.0)
MCH: 33.1 pg (ref 26.0–34.0)
MCHC: 34.8 g/dL (ref 30.0–36.0)
MCV: 95.2 fL (ref 78.0–100.0)
Platelets: 209 10*3/uL (ref 150–400)
RBC: 4.14 MIL/uL (ref 3.87–5.11)
RDW: 12.8 % (ref 11.5–15.5)
WBC: 10.9 10*3/uL — ABNORMAL HIGH (ref 4.0–10.5)

## 2010-09-20 LAB — BASIC METABOLIC PANEL
CO2: 25 mEq/L (ref 19–32)
Calcium: 9.4 mg/dL (ref 8.4–10.5)
Creatinine, Ser: 0.72 mg/dL (ref 0.4–1.2)
GFR calc Af Amer: >60 mL/min (ref >60)
GFR calc non Af Amer: >60 mL/min (ref >60)
Sodium: 137 mEq/L (ref 135–145)

## 2010-09-25 ENCOUNTER — Emergency Department (HOSPITAL_COMMUNITY)
Admission: EM | Admit: 2010-09-25 | Discharge: 2010-09-26 | Disposition: A | Discharge status: Home / Self Care | Payer: Medicare Other | Attending: Emergency Medicine | Admitting: Emergency Medicine

## 2010-09-25 DIAGNOSIS — E119 Type 2 diabetes mellitus without complications: Secondary | ICD-10-CM | POA: Insufficient documentation

## 2010-09-25 DIAGNOSIS — G309 Alzheimer's disease, unspecified: Secondary | ICD-10-CM | POA: Insufficient documentation

## 2010-09-25 DIAGNOSIS — F0280 Dementia in other diseases classified elsewhere without behavioral disturbance: Secondary | ICD-10-CM | POA: Insufficient documentation

## 2010-09-25 DIAGNOSIS — Z79899 Other long term (current) drug therapy: Secondary | ICD-10-CM | POA: Insufficient documentation

## 2010-09-25 DIAGNOSIS — E039 Hypothyroidism, unspecified: Secondary | ICD-10-CM | POA: Insufficient documentation

## 2010-09-25 LAB — GLUCOSE, CAPILLARY: Glucose-Capillary: 468 mg/dL — ABNORMAL HIGH (ref 70–99)

## 2010-09-26 LAB — POCT I-STAT, CHEM 8
Calcium, Ion: 1.16 mmol/L (ref 1.12–1.32)
Chloride: 102 mEq/L (ref 96–112)
Glucose, Bld: 443 mg/dL — ABNORMAL HIGH (ref 70–99)
HCT: 40 % (ref 36.0–46.0)
Hemoglobin: 13.6 g/dL (ref 12.0–15.0)
TCO2: 28 mmol/L (ref 0–100)

## 2010-09-26 LAB — URINALYSIS, ROUTINE W REFLEX MICROSCOPIC
Hgb urine dipstick: NEGATIVE
Ketones, ur: NEGATIVE mg/dL
Leukocytes, UA: NEGATIVE
Protein, ur: NEGATIVE mg/dL
Urobilinogen, UA: 1 mg/dL (ref 0.0–1.0)

## 2010-09-26 LAB — CBC
MCH: 32.3 pg (ref 26.0–34.0)
MCHC: 33.9 g/dL (ref 30.0–36.0)
RDW: 12.8 % (ref 11.5–15.5)

## 2010-09-26 LAB — DIFFERENTIAL
Basophils Absolute: 0 10*3/uL (ref 0.0–0.1)
Basophils Relative: 0 % (ref 0–1)
Eosinophils Absolute: 0.1 10*3/uL (ref 0.0–0.7)
Eosinophils Relative: 2 % (ref 0–5)
Monocytes Absolute: 0.4 10*3/uL (ref 0.1–1.0)
Monocytes Relative: 9 % (ref 3–12)

## 2010-09-26 LAB — URINE MICROSCOPIC-ADD ON

## 2010-09-27 LAB — URINE CULTURE
Colony Count: NO GROWTH
Culture: NO GROWTH

## 2010-10-27 ENCOUNTER — Emergency Department (HOSPITAL_COMMUNITY)
Admission: EM | Admit: 2010-10-27 | Discharge: 2010-10-28 | Disposition: A | Discharge status: Home / Self Care | Payer: Medicare Other | Attending: Emergency Medicine | Admitting: Emergency Medicine

## 2010-10-27 DIAGNOSIS — E119 Type 2 diabetes mellitus without complications: Secondary | ICD-10-CM | POA: Insufficient documentation

## 2010-10-27 DIAGNOSIS — F0280 Dementia in other diseases classified elsewhere without behavioral disturbance: Secondary | ICD-10-CM | POA: Insufficient documentation

## 2010-10-27 DIAGNOSIS — E039 Hypothyroidism, unspecified: Secondary | ICD-10-CM | POA: Insufficient documentation

## 2010-10-27 DIAGNOSIS — G309 Alzheimer's disease, unspecified: Secondary | ICD-10-CM | POA: Insufficient documentation

## 2010-10-27 DIAGNOSIS — F0281 Dementia in other diseases classified elsewhere with behavioral disturbance: Secondary | ICD-10-CM | POA: Insufficient documentation

## 2010-10-27 DIAGNOSIS — F02818 Dementia in other diseases classified elsewhere, unspecified severity, with other behavioral disturbance: Secondary | ICD-10-CM | POA: Insufficient documentation

## 2010-10-27 LAB — GLUCOSE, CAPILLARY: Glucose-Capillary: 317 mg/dL — ABNORMAL HIGH (ref 70–99)

## 2010-10-28 LAB — URINALYSIS, ROUTINE W REFLEX MICROSCOPIC
Bilirubin Urine: NEGATIVE
Glucose, UA: >1000 mg/dL — AB
Hgb urine dipstick: NEGATIVE
Specific Gravity, Urine: 1.024 (ref 1.005–1.030)
Urobilinogen, UA: 0.2 mg/dL (ref 0.0–1.0)
pH: 6 (ref 5.0–8.0)

## 2010-10-28 LAB — POCT I-STAT, CHEM 8
BUN: 21 mg/dL (ref 6–23)
Creatinine, Ser: 0.9 mg/dL (ref 0.50–1.10)
Glucose, Bld: 286 mg/dL — ABNORMAL HIGH (ref 70–99)
Hemoglobin: 13.6 g/dL (ref 12.0–15.0)
Potassium: 5 mEq/L (ref 3.5–5.1)
Sodium: 140 mEq/L (ref 135–145)

## 2010-10-28 LAB — DIFFERENTIAL
Basophils Absolute: 0 10*3/uL (ref 0.0–0.1)
Basophils Relative: 0 % (ref 0–1)
Lymphocytes Relative: 36 % (ref 12–46)
Monocytes Absolute: 0.5 10*3/uL (ref 0.1–1.0)
Neutro Abs: 2.6 10*3/uL (ref 1.7–7.7)
Neutrophils Relative %: 52 % (ref 43–77)

## 2010-10-28 LAB — CBC
HCT: 39.1 % (ref 36.0–46.0)
Hemoglobin: 13.2 g/dL (ref 12.0–15.0)
MCHC: 33.8 g/dL (ref 30.0–36.0)
RDW: 12.7 % (ref 11.5–15.5)
WBC: 5 10*3/uL (ref 4.0–10.5)

## 2010-10-28 LAB — BASIC METABOLIC PANEL
Calcium: 9.9 mg/dL (ref 8.4–10.5)
GFR calc Af Amer: >60 mL/min (ref >60)
GFR calc non Af Amer: >60 mL/min (ref >60)
Glucose, Bld: 223 mg/dL — ABNORMAL HIGH (ref 70–99)
Potassium: 4.8 mEq/L (ref 3.5–5.1)
Sodium: 137 mEq/L (ref 135–145)

## 2010-10-28 LAB — URINE MICROSCOPIC-ADD ON

## 2010-10-28 LAB — GLUCOSE, CAPILLARY: Glucose-Capillary: 162 mg/dL — ABNORMAL HIGH (ref 70–99)

## 2010-10-30 LAB — URINE CULTURE: Culture  Setup Time: 201207201202

## 2010-11-11 ENCOUNTER — Emergency Department (HOSPITAL_COMMUNITY)
Admission: EM | Admit: 2010-11-11 | Discharge: 2010-11-12 | Disposition: A | Discharge status: Home / Self Care | Payer: Medicare Other | Attending: Emergency Medicine | Admitting: Emergency Medicine

## 2010-11-11 DIAGNOSIS — R197 Diarrhea, unspecified: Secondary | ICD-10-CM | POA: Insufficient documentation

## 2010-11-11 DIAGNOSIS — E039 Hypothyroidism, unspecified: Secondary | ICD-10-CM | POA: Insufficient documentation

## 2010-11-11 DIAGNOSIS — F0280 Dementia in other diseases classified elsewhere without behavioral disturbance: Secondary | ICD-10-CM | POA: Insufficient documentation

## 2010-11-11 DIAGNOSIS — G309 Alzheimer's disease, unspecified: Secondary | ICD-10-CM | POA: Insufficient documentation

## 2010-11-11 DIAGNOSIS — F329 Major depressive disorder, single episode, unspecified: Secondary | ICD-10-CM | POA: Insufficient documentation

## 2010-11-11 DIAGNOSIS — E119 Type 2 diabetes mellitus without complications: Secondary | ICD-10-CM | POA: Insufficient documentation

## 2010-11-11 DIAGNOSIS — F3289 Other specified depressive episodes: Secondary | ICD-10-CM | POA: Insufficient documentation

## 2010-11-11 DIAGNOSIS — R112 Nausea with vomiting, unspecified: Secondary | ICD-10-CM | POA: Insufficient documentation

## 2010-11-12 LAB — CBC
HCT: 39.1 % (ref 36.0–46.0)
Hemoglobin: 13.5 g/dL (ref 12.0–15.0)
MCH: 33.3 pg (ref 26.0–34.0)
MCV: 96.3 fL (ref 78.0–100.0)
RBC: 4.06 MIL/uL (ref 3.87–5.11)

## 2010-11-12 LAB — POCT I-STAT, CHEM 8
BUN: 31 mg/dL — ABNORMAL HIGH (ref 6–23)
Chloride: 109 mEq/L (ref 96–112)
Creatinine, Ser: 0.9 mg/dL (ref 0.50–1.10)
Sodium: 142 mEq/L (ref 135–145)

## 2010-11-12 LAB — URINALYSIS, ROUTINE W REFLEX MICROSCOPIC
Bilirubin Urine: NEGATIVE
Glucose, UA: 250 mg/dL — AB
Leukocytes, UA: NEGATIVE
pH: 5 (ref 5.0–8.0)

## 2010-11-12 LAB — URINE MICROSCOPIC-ADD ON

## 2010-11-12 LAB — DIFFERENTIAL
Lymphs Abs: 0.8 10*3/uL (ref 0.7–4.0)
Monocytes Absolute: 0.4 10*3/uL (ref 0.1–1.0)
Monocytes Relative: 4 % (ref 3–12)
Neutro Abs: 8.2 10*3/uL — ABNORMAL HIGH (ref 1.7–7.7)
Neutrophils Relative %: 88 % — ABNORMAL HIGH (ref 43–77)

## 2011-01-14 ENCOUNTER — Emergency Department (HOSPITAL_COMMUNITY)
Admission: EM | Admit: 2011-01-14 | Discharge: 2011-01-15 | Disposition: A | Discharge status: Home / Self Care | Payer: Medicare Other | Attending: Emergency Medicine | Admitting: Emergency Medicine

## 2011-01-14 DIAGNOSIS — E119 Type 2 diabetes mellitus without complications: Secondary | ICD-10-CM | POA: Insufficient documentation

## 2011-01-14 DIAGNOSIS — Z7982 Long term (current) use of aspirin: Secondary | ICD-10-CM | POA: Insufficient documentation

## 2011-01-14 DIAGNOSIS — E039 Hypothyroidism, unspecified: Secondary | ICD-10-CM | POA: Insufficient documentation

## 2011-01-14 DIAGNOSIS — Z79899 Other long term (current) drug therapy: Secondary | ICD-10-CM | POA: Insufficient documentation

## 2011-01-14 DIAGNOSIS — F068 Other specified mental disorders due to known physiological condition: Secondary | ICD-10-CM | POA: Insufficient documentation

## 2011-01-14 DIAGNOSIS — F3289 Other specified depressive episodes: Secondary | ICD-10-CM | POA: Insufficient documentation

## 2011-01-14 DIAGNOSIS — F329 Major depressive disorder, single episode, unspecified: Secondary | ICD-10-CM | POA: Insufficient documentation

## 2011-01-14 DIAGNOSIS — G309 Alzheimer's disease, unspecified: Secondary | ICD-10-CM | POA: Insufficient documentation

## 2011-01-14 DIAGNOSIS — N390 Urinary tract infection, site not specified: Secondary | ICD-10-CM | POA: Insufficient documentation

## 2011-01-14 DIAGNOSIS — K219 Gastro-esophageal reflux disease without esophagitis: Secondary | ICD-10-CM | POA: Insufficient documentation

## 2011-01-14 DIAGNOSIS — F0280 Dementia in other diseases classified elsewhere without behavioral disturbance: Secondary | ICD-10-CM | POA: Insufficient documentation

## 2011-01-14 LAB — GLUCOSE, CAPILLARY: Glucose-Capillary: 354 mg/dL — ABNORMAL HIGH (ref 70–99)

## 2011-01-15 LAB — BASIC METABOLIC PANEL
BUN: 32 mg/dL — ABNORMAL HIGH (ref 6–23)
Creatinine, Ser: 0.97 mg/dL (ref 0.50–1.10)
GFR calc Af Amer: 60 mL/min — ABNORMAL LOW (ref >90)
GFR calc non Af Amer: 51 mL/min — ABNORMAL LOW (ref >90)
Potassium: 3.9 mEq/L (ref 3.5–5.1)

## 2011-01-15 LAB — URINE MICROSCOPIC-ADD ON

## 2011-01-15 LAB — DIFFERENTIAL
Basophils Absolute: 0 10*3/uL (ref 0.0–0.1)
Eosinophils Absolute: 0.1 10*3/uL (ref 0.0–0.7)
Eosinophils Relative: 2 % (ref 0–5)
Lymphocytes Relative: 29 % (ref 12–46)
Monocytes Absolute: 0.5 10*3/uL (ref 0.1–1.0)

## 2011-01-15 LAB — URINALYSIS, ROUTINE W REFLEX MICROSCOPIC
Bilirubin Urine: NEGATIVE
Glucose, UA: >1000 mg/dL — AB
Ketones, ur: NEGATIVE mg/dL
pH: 5 (ref 5.0–8.0)

## 2011-01-15 LAB — CBC
HCT: 37.4 % (ref 36.0–46.0)
MCHC: 35.3 g/dL (ref 30.0–36.0)
RDW: 12.8 % (ref 11.5–15.5)

## 2011-01-17 LAB — URINE CULTURE: Culture  Setup Time: 201210071140

## 2011-01-27 ENCOUNTER — Ambulatory Visit: Payer: Medicare Other | Admitting: Family Medicine

## 2011-02-03 ENCOUNTER — Ambulatory Visit (INDEPENDENT_AMBULATORY_CARE_PROVIDER_SITE_OTHER): Payer: Medicare Other | Admitting: Family Medicine

## 2011-02-03 ENCOUNTER — Encounter: Payer: Self-pay | Admitting: Family Medicine

## 2011-02-03 VITALS — BP 140/70 | Temp 97.7°F | Wt 119.0 lb

## 2011-02-03 DIAGNOSIS — E039 Hypothyroidism, unspecified: Secondary | ICD-10-CM

## 2011-02-03 DIAGNOSIS — E119 Type 2 diabetes mellitus without complications: Secondary | ICD-10-CM

## 2011-02-03 DIAGNOSIS — E785 Hyperlipidemia, unspecified: Secondary | ICD-10-CM

## 2011-02-03 DIAGNOSIS — F0280 Dementia in other diseases classified elsewhere without behavioral disturbance: Secondary | ICD-10-CM

## 2011-02-03 DIAGNOSIS — Z23 Encounter for immunization: Secondary | ICD-10-CM

## 2011-02-03 DIAGNOSIS — G309 Alzheimer's disease, unspecified: Secondary | ICD-10-CM

## 2011-02-03 NOTE — Progress Notes (Signed)
  Subjective:    Patient ID: Colleen Collins, female    DOB: [birth year], 77 y.o.   MRN: [id number]  HPI  Elderly female seen for followup. Past medical history reviewed. She has Alzheimer's dementia, hypothyroidism, type 2 diabetes, hyperlipidemia, and history of GERD. Recently went to emergency room with elevated blood sugar of 354. She was noted to have UTI. Urine culture grew out Escherichia coli. Sensitive to Macrobid and she was treated and does not have any other obvious symptoms at this time. Had some other lab work which was basically unremarkable.  Type 2 diabetes for several years. Currently takes glyburide, metformin, and Actos. Blood sugars have not been well controlled. Fasting blood sugars generally around 200. No reported hypoglycemia recently. No hemoglobin A1c in several months.  She has advanced dementia. Previously was treated with Aricept but had intolerance with nausea and vomiting. Husband very supportive in care.  Hypothyroidism. Compliant with medication. Needs reassessment.  Past Medical History  Diagnosis Date  . Allergy   . GERD (gastroesophageal reflux disease)   . Chronic kidney disease     stones  . Ulcer   . Hyperlipidemia   . Hx of colonic polyps   . Arrhythmia    Past Surgical History  Procedure Date  . Tonsillectomy     reports that she has never smoked. She has never used smokeless tobacco. She reports that she does not drink alcohol or use illicit drugs. family history includes Diabetes in an unspecified family member; Kidney disease in an unspecified family member; and Sudden death in an unspecified family member. No Known Allergies    Review of Systems  Constitutional: Negative for appetite change.  Respiratory: Negative for shortness of breath.   Cardiovascular: Negative for chest pain.  limited ROS obtained and ?reliability based on her dementia.     Objective:   Physical Exam  Constitutional: She appears well-developed and well-nourished.         Demented but alert and cooperative in no distress  Neck: Neck supple. No thyromegaly present.  Cardiovascular: Normal rate and regular rhythm.   Pulmonary/Chest: Breath sounds normal. No respiratory distress. She has no wheezes. She has no rales.  Musculoskeletal: She exhibits no edema.  Lymphadenopathy:    She has no cervical adenopathy.  Neurological: She is alert. No cranial nerve deficit.       Patient is not oriented to date, year or, or day of week          Assessment & Plan:  #1 recent UTI. Asymptomatic at this time. Follow for now #2 advanced dementia  #3 type 2 diabetes. History of poor control. Recheck A1c. Would definitely lean toward less tight control given her age and dementia status #4 hypothyroidism. Recheck TSH #5 health maintenance. Flu vaccine given

## 2011-02-04 LAB — HEMOGLOBIN A1C
Hgb A1c MFr Bld: 10.7 % — ABNORMAL HIGH (ref <5.7)
Mean Plasma Glucose: 260 mg/dL — ABNORMAL HIGH (ref <117)

## 2011-02-07 NOTE — Progress Notes (Signed)
Quick Note:  Pt husband informed, copy will be mailed to home to discuss with family. ______

## 2011-02-19 ENCOUNTER — Inpatient Hospital Stay (HOSPITAL_COMMUNITY)
Admission: EM | Admit: 2011-02-19 | Discharge: 2011-02-24 | DRG: 683 | Disposition: A | Discharge status: Home Health | Payer: Medicare Other | Attending: Internal Medicine | Admitting: Internal Medicine

## 2011-02-19 ENCOUNTER — Emergency Department (HOSPITAL_COMMUNITY): Payer: Medicare Other

## 2011-02-19 ENCOUNTER — Encounter (HOSPITAL_COMMUNITY): Payer: Self-pay | Admitting: Emergency Medicine

## 2011-02-19 DIAGNOSIS — Z79899 Other long term (current) drug therapy: Secondary | ICD-10-CM

## 2011-02-19 DIAGNOSIS — Z23 Encounter for immunization: Secondary | ICD-10-CM

## 2011-02-19 DIAGNOSIS — E876 Hypokalemia: Secondary | ICD-10-CM | POA: Diagnosis present

## 2011-02-19 DIAGNOSIS — E039 Hypothyroidism, unspecified: Secondary | ICD-10-CM | POA: Diagnosis present

## 2011-02-19 DIAGNOSIS — E119 Type 2 diabetes mellitus without complications: Secondary | ICD-10-CM | POA: Diagnosis present

## 2011-02-19 DIAGNOSIS — Z860100 Personal history of colon polyps, unspecified: Secondary | ICD-10-CM

## 2011-02-19 DIAGNOSIS — J209 Acute bronchitis, unspecified: Secondary | ICD-10-CM | POA: Diagnosis present

## 2011-02-19 DIAGNOSIS — Z66 Do not resuscitate: Secondary | ICD-10-CM | POA: Diagnosis present

## 2011-02-19 DIAGNOSIS — R739 Hyperglycemia, unspecified: Secondary | ICD-10-CM | POA: Diagnosis present

## 2011-02-19 DIAGNOSIS — K5900 Constipation, unspecified: Secondary | ICD-10-CM | POA: Diagnosis present

## 2011-02-19 DIAGNOSIS — N179 Acute kidney failure, unspecified: Secondary | ICD-10-CM | POA: Diagnosis present

## 2011-02-19 DIAGNOSIS — G309 Alzheimer's disease, unspecified: Secondary | ICD-10-CM | POA: Diagnosis present

## 2011-02-19 DIAGNOSIS — E7251 Non-ketotic hyperglycinemia: Secondary | ICD-10-CM

## 2011-02-19 DIAGNOSIS — K219 Gastro-esophageal reflux disease without esophagitis: Secondary | ICD-10-CM | POA: Diagnosis present

## 2011-02-19 DIAGNOSIS — N182 Chronic kidney disease, stage 2 (mild): Secondary | ICD-10-CM | POA: Diagnosis present

## 2011-02-19 DIAGNOSIS — E785 Hyperlipidemia, unspecified: Secondary | ICD-10-CM | POA: Diagnosis present

## 2011-02-19 DIAGNOSIS — N390 Urinary tract infection, site not specified: Secondary | ICD-10-CM | POA: Diagnosis present

## 2011-02-19 DIAGNOSIS — F0280 Dementia in other diseases classified elsewhere without behavioral disturbance: Secondary | ICD-10-CM

## 2011-02-19 DIAGNOSIS — R627 Adult failure to thrive: Secondary | ICD-10-CM | POA: Diagnosis present

## 2011-02-19 LAB — POCT I-STAT, CHEM 8
BUN: 32 mg/dL — ABNORMAL HIGH (ref 6–23)
Calcium, Ion: 1.18 mmol/L (ref 1.12–1.32)
Glucose, Bld: 249 mg/dL — ABNORMAL HIGH (ref 70–99)
TCO2: 28 mmol/L (ref 0–100)

## 2011-02-19 LAB — URINALYSIS, ROUTINE W REFLEX MICROSCOPIC
Ketones, ur: 15 mg/dL — AB
Leukocytes, UA: NEGATIVE
Nitrite: NEGATIVE
Specific Gravity, Urine: 1.031 — ABNORMAL HIGH (ref 1.005–1.030)
Urobilinogen, UA: 1 mg/dL (ref 0.0–1.0)
pH: 5 (ref 5.0–8.0)

## 2011-02-19 LAB — URINE MICROSCOPIC-ADD ON

## 2011-02-19 LAB — GLUCOSE, CAPILLARY: Glucose-Capillary: 324 mg/dL — ABNORMAL HIGH (ref 70–99)

## 2011-02-19 LAB — CBC
MCH: 33.2 pg (ref 26.0–34.0)
Platelets: 253 10*3/uL (ref 150–400)
RBC: 3.65 MIL/uL — ABNORMAL LOW (ref 3.87–5.11)
WBC: 6 10*3/uL (ref 4.0–10.5)

## 2011-02-19 MED ORDER — CEFTRIAXONE SODIUM 1 G IJ SOLR
1.0000 g | Freq: Once | INTRAMUSCULAR | Status: AC
  Administered 2011-02-19: 1 g via INTRAMUSCULAR
  Filled 2011-02-19 (×2): qty 10

## 2011-02-19 MED ORDER — SODIUM CHLORIDE 0.9 % IV BOLUS (SEPSIS)
250.0000 mL | Freq: Once | INTRAVENOUS | Status: AC
  Administered 2011-02-19: 1000 mL via INTRAVENOUS

## 2011-02-19 MED ORDER — SODIUM CHLORIDE 0.9 % IV SOLN: Freq: Once | INTRAVENOUS | Status: DC

## 2011-02-19 MED ORDER — LIDOCAINE HCL (PF) 1 % IJ SOLN
INTRAMUSCULAR | Status: AC
  Administered 2011-02-19: 23:00:00
  Filled 2011-02-19: qty 5

## 2011-02-19 NOTE — ED Provider Notes (Signed)
Medical screening examination/treatment/procedure(s) were performed by non-physician practitioner and as supervising physician I was immediately available for consultation/collaboration.   Geoffery Lyons, MD 02/19/11 2308

## 2011-02-19 NOTE — ED Provider Notes (Addendum)
History     CSN: [id number] Arrival date & time: 02/19/2011  4:09 PM   First MD Initiated Contact with Patient 02/19/11 2031      Chief Complaint  Patient presents with  . Hyperglycemia    (Consider location/radiation/quality/duration/timing/severity/associated sxs/prior treatment) HPI Comments: Colleen Collins is cared for by her husband to to advanced Alzheimer's.  He reports, that she's had no by mouth intake for 2 days has been suffering with cough and URI symptoms for the last week.  He became concerned today when her mental acuity became less and he, feels she's hallucinating seeing things that are not failure, which is new for her.  He does not report any vomiting, diarrhea, or change in her ability to ambulate  The history is provided by a caregiver and the spouse.    Past Medical History  Diagnosis Date  . Allergy   . GERD (gastroesophageal reflux disease)   . Chronic kidney disease     stones  . Ulcer   . Hyperlipidemia   . Hx of colonic polyps   . Arrhythmia   . Diabetes mellitus     Past Surgical History  Procedure Date  . Tonsillectomy     Family History  Problem Relation Age of Onset  . Diabetes      fhx  . Kidney disease      fhx  . Sudden death      fhx    History  Substance Use Topics  . Smoking status: Never Smoker   . Smokeless tobacco: Never Used  . Alcohol Use: No    OB History    Grav Para Term Preterm Abortions TAB SAB Ect Mult Living                  Review of Systems  Constitutional: Positive for activity change and appetite change. Negative for fever.  HENT: Negative.   Eyes: Negative.   Respiratory: Positive for cough.   Cardiovascular: Negative for chest pain.  Gastrointestinal: Negative for vomiting and abdominal pain.  Genitourinary: Positive for decreased urine volume.  Musculoskeletal: Negative.   Skin: Positive for pallor.  Neurological: Positive for weakness.  Hematological: Negative.   Psychiatric/Behavioral:  Positive for hallucinations.    Allergies  Review of patient's allergies indicates no known allergies.  Home Medications   Current Outpatient Rx  Name Route Sig Dispense Refill  . ACTOS 45 MG PO TABS Oral Take 45 mg by mouth daily.     . GLYBURIDE 5 MG PO TABS Oral Take 5 mg by mouth daily.     Marland Kitchen METFORMIN HCL 1000 MG PO TABS Oral Take 1,000 mg by mouth daily.     Marland Kitchen SIMVASTATIN 40 MG PO TABS Oral Take 40 mg by mouth at bedtime.       BP 143/81  Pulse 76  Temp(Src) 98.5 F (36.9 C) (Oral)  Resp 16  SpO2 97%  Physical Exam  Constitutional: She appears well-developed and well-nourished.  HENT:  Head: Normocephalic.  Eyes: EOM are normal.  Neck: Normal range of motion.  Cardiovascular: Normal rate.   Pulmonary/Chest: No respiratory distress.       Lung sounds clear throughout all 4 fields, but patient is noted to be coughing frequently  Abdominal: Soft. She exhibits no distension.  Musculoskeletal: Normal range of motion.  Neurological: She is alert.  Skin: There is pallor.    ED Course  Procedures (including critical care time)  Labs Reviewed  GLUCOSE, CAPILLARY - Abnormal; Notable for the  following:    Glucose-Capillary 324 (*)    All other components within normal limits  CBC - Abnormal; Notable for the following:    RBC 3.65 (*)    HCT 35.5 (*)    All other components within normal limits  URINALYSIS, ROUTINE W REFLEX MICROSCOPIC - Abnormal; Notable for the following:    Color, Urine AMBER (*) BIOCHEMICALS MAY BE AFFECTED BY COLOR   Specific Gravity, Urine 1.031 (*)    Glucose, UA >1000 (*)    Bilirubin Urine SMALL (*)    Ketones, ur 15 (*)    All other components within normal limits  GLUCOSE, CAPILLARY - Abnormal; Notable for the following:    Glucose-Capillary 239 (*)    All other components within normal limits  POCT I-STAT, CHEM 8 - Abnormal; Notable for the following:    BUN 32 (*)    Creatinine, Ser 1.40 (*)    Glucose, Bld 249 (*)    All other  components within normal limits  URINE MICROSCOPIC-ADD ON - Abnormal; Notable for the following:    Casts HYALINE CASTS (*)    All other components within normal limits  POCT CBG MONITORING  I-STAT, CHEM 8  URINE CULTURE   her records Ms. Mcconaghy's, creatinine, less than a month ago, was 0.97, indicating acute dehydration Dg Chest 2 View  02/19/2011  *RADIOLOGY REPORT*  Clinical Data: 77 year old female with cough.  CHEST - 2 VIEW  Comparison: 05/21/2010 and earlier.  Findings: Seated AP and lateral views of the chest.  Lower lung volumes.  Cardiac size and mediastinal contours are within normal limits.  No pneumothorax, pulmonary edema, pleural effusion or acute pulmonary opacity.  Right upper quadrant surgical clips. No acute osseous abnormality identified.  IMPRESSION: No acute cardiopulmonary abnormality.  Original Report Authenticated By: Ulla Potash III, M.D.     1. Urinary tract infection   2. Hyperglycinemia       MDM  Patient's electrolytes will be checked, as well as CBC, urine, and chest x-ray to evaluate for source of her decreased appetite and overall general decline        Arman Filter, NP 02/19/11 2055  Arman Filter, NP 02/19/11 2142  Arman Filter, NP 02/19/11 2252  Arman Filter, NP 02/19/11 2347  Arman Filter, NP 02/19/11 2355

## 2011-02-19 NOTE — ED Notes (Signed)
Family reports pt has had cold symptoms for 1 week. Pt has not been eating for 2 days. Pt CBG checked at home was 408. Pt is hallucinating. Pt has not had her medications today.

## 2011-02-20 ENCOUNTER — Encounter (HOSPITAL_COMMUNITY): Payer: Self-pay | Admitting: *Deleted

## 2011-02-20 DIAGNOSIS — R627 Adult failure to thrive: Secondary | ICD-10-CM | POA: Diagnosis present

## 2011-02-20 DIAGNOSIS — N179 Acute kidney failure, unspecified: Secondary | ICD-10-CM | POA: Diagnosis present

## 2011-02-20 DIAGNOSIS — N390 Urinary tract infection, site not specified: Secondary | ICD-10-CM | POA: Diagnosis present

## 2011-02-20 DIAGNOSIS — R739 Hyperglycemia, unspecified: Secondary | ICD-10-CM | POA: Diagnosis present

## 2011-02-20 LAB — GLUCOSE, CAPILLARY
Glucose-Capillary: 258 mg/dL — ABNORMAL HIGH (ref 70–99)
Glucose-Capillary: 102 mg/dL — ABNORMAL HIGH (ref 70–99)
Glucose-Capillary: 168 mg/dL — ABNORMAL HIGH (ref 70–99)

## 2011-02-20 LAB — BASIC METABOLIC PANEL
CO2: 27 mEq/L (ref 19–32)
Calcium: 9.2 mg/dL (ref 8.4–10.5)
Creatinine, Ser: 0.92 mg/dL (ref 0.50–1.10)
Glucose, Bld: 223 mg/dL — ABNORMAL HIGH (ref 70–99)
Sodium: 140 mEq/L (ref 135–145)

## 2011-02-20 LAB — CBC
MCH: 33 pg (ref 26.0–34.0)
MCV: 97.8 fL (ref 78.0–100.0)
Platelets: 241 10*3/uL (ref 150–400)
RBC: 3.64 MIL/uL — ABNORMAL LOW (ref 3.87–5.11)

## 2011-02-20 MED ORDER — LEVOTHYROXINE SODIUM 75 MCG PO TABS
75.0000 ug | ORAL_TABLET | Freq: Every day | ORAL | Status: DC
  Administered 2011-02-20 – 2011-02-24 (×5): 75 ug via ORAL
  Filled 2011-02-20 (×6): qty 1

## 2011-02-20 MED ORDER — OXYCODONE HCL 5 MG PO TABS: 5.0000 mg | ORAL_TABLET | ORAL | Status: DC | PRN

## 2011-02-20 MED ORDER — INSULIN PEN STARTER KIT
1.0000 | Freq: Once | Status: AC
  Administered 2011-02-20: 1
  Filled 2011-02-20: qty 1

## 2011-02-20 MED ORDER — ENOXAPARIN SODIUM 40 MG/0.4ML ~~LOC~~ SOLN
40.0000 mg | SUBCUTANEOUS | Status: DC
  Administered 2011-02-20 – 2011-02-23 (×4): 40 mg via SUBCUTANEOUS
  Filled 2011-02-20 (×4): qty 0.4

## 2011-02-20 MED ORDER — ALUM & MAG HYDROXIDE-SIMETH 200-200-20 MG/5ML PO SUSP: 30.0000 mL | Freq: Four times a day (QID) | ORAL | Status: DC | PRN

## 2011-02-20 MED ORDER — ONDANSETRON HCL 4 MG PO TABS: 4.0000 mg | ORAL_TABLET | Freq: Four times a day (QID) | ORAL | Status: DC | PRN

## 2011-02-20 MED ORDER — HYDROMORPHONE HCL PF 1 MG/ML IJ SOLN: 0.5000 mg | INTRAMUSCULAR | Status: DC | PRN

## 2011-02-20 MED ORDER — SENNA 8.6 MG PO TABS: 2.0000 | ORAL_TABLET | Freq: Every day | ORAL | Status: DC | PRN

## 2011-02-20 MED ORDER — DEXTROSE 5 % IV SOLN: 1.0000 g | INTRAVENOUS | Status: DC

## 2011-02-20 MED ORDER — DOXYCYCLINE HYCLATE 100 MG PO TABS
100.0000 mg | ORAL_TABLET | Freq: Two times a day (BID) | ORAL | Status: DC
  Administered 2011-02-20 – 2011-02-24 (×8): 100 mg via ORAL
  Filled 2011-02-20 (×10): qty 1

## 2011-02-20 MED ORDER — INSULIN ASPART 100 UNIT/ML ~~LOC~~ SOLN
0.0000 [IU] | Freq: Every day | SUBCUTANEOUS | Status: DC
  Filled 2011-02-20: qty 3

## 2011-02-20 MED ORDER — DIPHENHYDRAMINE HCL 12.5 MG/5ML PO ELIX
12.5000 mg | ORAL_SOLUTION | Freq: Every evening | ORAL | Status: DC | PRN
  Filled 2011-02-20: qty 5

## 2011-02-20 MED ORDER — GLYBURIDE 5 MG PO TABS
5.0000 mg | ORAL_TABLET | Freq: Every day | ORAL | Status: DC
  Administered 2011-02-20 – 2011-02-24 (×5): 5 mg via ORAL
  Filled 2011-02-20 (×6): qty 1

## 2011-02-20 MED ORDER — INSULIN GLARGINE 100 UNIT/ML ~~LOC~~ SOLN
8.0000 [IU] | Freq: Every day | SUBCUTANEOUS | Status: DC
  Administered 2011-02-20 – 2011-02-24 (×5): 8 [IU] via SUBCUTANEOUS
  Filled 2011-02-20: qty 3

## 2011-02-20 MED ORDER — TRAZODONE HCL 50 MG PO TABS
50.0000 mg | ORAL_TABLET | Freq: Every evening | ORAL | Status: DC | PRN
  Administered 2011-02-21 (×2): 50 mg via ORAL
  Filled 2011-02-20 (×2): qty 1

## 2011-02-20 MED ORDER — INSULIN ASPART 100 UNIT/ML ~~LOC~~ SOLN
0.0000 [IU] | Freq: Three times a day (TID) | SUBCUTANEOUS | Status: DC
  Administered 2011-02-20: 5 [IU] via SUBCUTANEOUS
  Administered 2011-02-20 – 2011-02-21 (×3): 2 [IU] via SUBCUTANEOUS
  Administered 2011-02-22: 1 [IU] via SUBCUTANEOUS
  Administered 2011-02-22: 2 [IU] via SUBCUTANEOUS
  Administered 2011-02-22: 3 [IU] via SUBCUTANEOUS
  Administered 2011-02-23 – 2011-02-24 (×2): 1 [IU] via SUBCUTANEOUS
  Filled 2011-02-20 (×2): qty 3

## 2011-02-20 MED ORDER — ACETAMINOPHEN 325 MG PO TABS: 650.0000 mg | ORAL_TABLET | Freq: Four times a day (QID) | ORAL | Status: DC | PRN

## 2011-02-20 MED ORDER — DONEPEZIL HCL 23 MG PO TABS
23.0000 mg | ORAL_TABLET | Freq: Every day | ORAL | Status: DC
  Administered 2011-02-20 – 2011-02-23 (×4): 23 mg via ORAL
  Filled 2011-02-20 (×5): qty 1

## 2011-02-20 MED ORDER — ONDANSETRON HCL 4 MG/2ML IJ SOLN
4.0000 mg | Freq: Four times a day (QID) | INTRAMUSCULAR | Status: DC | PRN
  Administered 2011-02-22 – 2011-02-23 (×3): 4 mg via INTRAVENOUS
  Filled 2011-02-20 (×3): qty 2

## 2011-02-20 MED ORDER — SIMVASTATIN 40 MG PO TABS
40.0000 mg | ORAL_TABLET | Freq: Every day | ORAL | Status: DC
  Administered 2011-02-20 – 2011-02-23 (×4): 40 mg via ORAL
  Filled 2011-02-20 (×5): qty 1

## 2011-02-20 MED ORDER — ACETAMINOPHEN 650 MG RE SUPP: 650.0000 mg | Freq: Four times a day (QID) | RECTAL | Status: DC | PRN

## 2011-02-20 MED ORDER — SODIUM CHLORIDE 0.9 % IV SOLN
INTRAVENOUS | Status: DC
  Administered 2011-02-20 – 2011-02-21 (×3): via INTRAVENOUS

## 2011-02-20 NOTE — H&P (Signed)
DATE OF ADMISSION:  02/20/2011   PCP:   [name], MD   Chief Complaint: Poor Appetite, worsening Confusion  HPI: Colleen Collins is an 77 y.o. female with a history of Alzheimer's Dementia who was brought to the Emergency Department for evaluation due to decreased appetite and worsening confusion over the past 3 days.  Her husband is at the bedside and gives the history.  He also reports that she has has increased blood sugars over the past few days as well.  She has not had any fever or chills or nausea or vomiting or diarrhea or constipation or complaints of chest pain or shortness of breath.  She also has not had any complaints of dysuria.    Past Medical History  Diagnosis Date  . Allergy   . GERD (gastroesophageal reflux disease)   . Chronic kidney disease     stones  . Ulcer   . Hyperlipidemia   . Hx of colonic polyps   . Arrhythmia   . Diabetes mellitus     Past Surgical History  Procedure Date  . Tonsillectomy     Medications:  HOME MEDS: Prior to Admission medications   Medication Sig Start Date End Date Taking? Authorizing Provider  glyBURIDE (DIABETA) 5 MG tablet Take 5 mg by mouth daily.    Yes Historical Provider, MD  simvastatin (ZOCOR) 40 MG tablet Take 40 mg by mouth at bedtime.  05/03/10  Yes Historical Provider, MD  Aricept 23mg  PO q day Levoxyl PO q day Metformin 1000mg  PO q day Actos 45mg  PO q AM Temazepam 30 mg PO qhs PRN Insomnia   Allergies:  No Known Allergies  Social History: Married and lives at home with her husband who is her primary caretaker.   reports that she has never smoked. She has never used smokeless tobacco. She reports that she does not drink alcohol or use illicit drugs.  Family History: Family History  Problem Relation Age of Onset  . Diabetes      fhx  . Kidney disease      fhx  . Sudden death      fhx    Review of Systems:  The patient denies fever, weight loss, vision loss, decreased hearing,  hoarseness, chest pain, syncope, dyspnea on exertion, peripheral edema, balance deficits, hemoptysis, abdominal pain, melena, hematochezia, severe indigestion/heartburn, hematuria, incontinence, genital sores, muscle weakness, suspicious skin lesions, transient blindness, difficulty walking, depression, unusual weight change, abnormal bleeding, enlarged lymph nodes, angioedema, and breast masses.  Physical Exam: Filed Vitals:   02/19/11 1612 02/19/11 1615 02/19/11 2344  BP:  104/52 143/81  Pulse: 73  76  Temp: 98.2 F (36.8 C)  98.5 F (36.9 C)  TempSrc: Oral  Oral  Resp: 18  16  SpO2: 94%  97%   Blood pressure 143/81, pulse 76, temperature 98.5 F (36.9 C), temperature source Oral, resp. rate 16, SpO2 97.00%.  GEN: Pleasant 76 year old well nourished well developed elderl person lying in the stretcher in no acute distress; cooperative with exam PSYCH: She is alert and oriented x4; does not appear anxious does not appear depressed; affect is normal HEENT: Normocephalic and Atraumatic, Mucous membranes pink; PERRLA; EOM intact; Fundi:  Benign;  No scleral icterus, Nares: Patent, Oropharynx: Clear Poor Dentition, Neck:  FROM, no cervical lymphadenopathy nor thyromegaly or carotid bruit; no JVD; CHEST WALL: No tenderness CHEST: Normal respiration, clear to auscultation bilaterally HEART: Regular rate and rhythm; no murmurs rubs or gallops BACK: No kyphosis or  scoliosis; no CVA tenderness ABDOMEN: Positive Bowel Sounds, soft non-tender; no masses, no organomegaly. Rectal Exam: Not done EXTREMITIES: No bone or joint deformity; age-appropriate arthropathy of the hands and knees; no edema; no ulcerations. Genitalia: not examined PULSES: 2+ and symmetric SKIN: no rash or ulceration CNS: Cranial nerves 2-12 grossly intact no focal neurologic deficit   Labs & Imaging Results for orders placed during the hospital encounter of 02/19/11 (from the past 48 hour(s))  GLUCOSE, CAPILLARY     Status:  Abnormal   Collection Time   02/19/11  4:25 PM      Component Value Range Comment   Glucose-Capillary 324 (*) 70 - 99 (mg/dL)    Comment 1 Notify RN     CBC     Status: Abnormal   Collection Time   02/19/11  8:56 PM      Component Value Range Comment   WBC 6.0  4.0 - 10.5 (K/uL)    RBC 3.65 (*) 3.87 - 5.11 (MIL/uL)    Hemoglobin 12.1  12.0 - 15.0 (g/dL)    HCT 16.1 (*) 09.6 - 46.0 (%)    MCV 97.3  78.0 - 100.0 (fL)    MCH 33.2  26.0 - 34.0 (pg)    MCHC 34.1  30.0 - 36.0 (g/dL)    RDW 04.5  40.9 - 81.1 (%)    Platelets 253  150 - 400 (K/uL)   GLUCOSE, CAPILLARY     Status: Abnormal   Collection Time   02/19/11  9:06 PM      Component Value Range Comment   Glucose-Capillary 239 (*) 70 - 99 (mg/dL)    Comment 1 Documented in Chart      Comment 2 Notify RN     POCT I-STAT, CHEM 8     Status: Abnormal   Collection Time   02/19/11  9:19 PM      Component Value Range Comment   Sodium 140  135 - 145 (mEq/L)    Potassium 4.1  3.5 - 5.1 (mEq/L)    Chloride 103  96 - 112 (mEq/L)    BUN 32 (*) 6 - 23 (mg/dL)    Creatinine, Ser 9.14 (*) 0.50 - 1.10 (mg/dL)    Glucose, Bld 782 (*) 70 - 99 (mg/dL)    Calcium, Ion 9.56  1.12 - 1.32 (mmol/L)    TCO2 28  0 - 100 (mmol/L)    Hemoglobin 12.2  12.0 - 15.0 (g/dL)    HCT 21.3  08.6 - 57.8 (%)   URINALYSIS, ROUTINE W REFLEX MICROSCOPIC     Status: Abnormal   Collection Time   02/19/11  9:50 PM      Component Value Range Comment   Color, Urine AMBER (*) YELLOW  BIOCHEMICALS MAY BE AFFECTED BY COLOR   Appearance CLEAR  CLEAR     Specific Gravity, Urine 1.031 (*) 1.005 - 1.030     pH 5.0  5.0 - 8.0     Glucose, UA >1000 (*) NEGATIVE (mg/dL)    Hgb urine dipstick NEGATIVE  NEGATIVE     Bilirubin Urine SMALL (*) NEGATIVE     Ketones, ur 15 (*) NEGATIVE (mg/dL)    Protein, ur NEGATIVE  NEGATIVE (mg/dL)    Urobilinogen, UA 1.0  0.0 - 1.0 (mg/dL)    Nitrite NEGATIVE  NEGATIVE     Leukocytes, UA NEGATIVE  NEGATIVE    URINE MICROSCOPIC-ADD ON      Status: Abnormal   Collection Time   02/19/11  9:50 PM      Component Value Range Comment   Squamous Epithelial / LPF RARE  RARE     WBC, UA 0-2  <3 (WBC/hpf)    RBC / HPF 0-2  <3 (RBC/hpf)    Bacteria, UA RARE  RARE     Casts HYALINE CASTS (*) NEGATIVE     Urine-Other MUCOUS PRESENT      Dg Chest 2 View  02/19/2011  *RADIOLOGY REPORT*  Clinical Data: 77 year old female with cough.  CHEST - 2 VIEW  Comparison: 05/21/2010 and earlier.  Findings: Seated AP and lateral views of the chest.  Lower lung volumes.  Cardiac size and mediastinal contours are within normal limits.  No pneumothorax, pulmonary edema, pleural effusion or acute pulmonary opacity.  Right upper quadrant surgical clips. No acute osseous abnormality identified.  IMPRESSION: No acute cardiopulmonary abnormality.  Original Report Authenticated By: Harley Hallmark, M.D.    Assessment: Failure to Thrive Present on Admission:  .ARF (acute renal failure) .UTI (lower urinary tract infection) .Hyperglycemia .HYPOTHYROIDISM .DIABETES MELLITUS, TYPE II Delirium due to UTI CKD Stage II Alzheimers Dementia   Plan:  Patient will be admitted to a Med/Surg Bed and a Urine C+S has been ordered, and empiric IV Rocephin has been started.  IVFs have been ordered for gentle rehydration, and SSI orders have been given for elevated blood sugars.  A TSH level will also be checked.   And her home medications have been reconciled.  Other plans as per orders.    CODE STATUS:   FULL CODE      Ron Parker 02/20/2011, 2:09 AM   CC:  [name], MD

## 2011-02-20 NOTE — Progress Notes (Signed)
Utilization Review Completed.  Leeandre Nordling T  02/20/2011 

## 2011-02-20 NOTE — Progress Notes (Signed)
Speech Language/Pathology Clinical/Bedside Swallow Evaluation   Clinical Impression Statement: Pt presented with normal swallow function with adequate bolus recognition and mastication; timely swallow trigger; no clinical symptoms of impaired airway protection.  Husband present for exam; discussed with him current normal swallowing status.  Educated re: the natural progession of dementia and its ultimate impact upon swallowing, as well as the benefits/burdens of long-term enteral feeding.  Discussed the value of making feeding decisions prior to the development of critical medical situations.   Spouse received information well and plans to discuss with godson at later time. Risk for Aspiration: Mild  Recommendations Solid Consistency: Regular Liquid Consistency: Thin Liquid Administration via: Straw;Cup Medication Administration: Whole meds with liquid Supervision: Patient able to self feed   No further SLP f/u warranted - will sign off.  Colleen Palecek L. Samson Frederic, MA CCC/SLP Pager [phone]   Colleen Collins 02/20/2011,11:36 AM

## 2011-02-20 NOTE — Progress Notes (Signed)
Physical Therapy Evaluation Patient Details Name: Colleen Collins MRN: [id number] DOB: [birth year] Today's Date: 02/20/2011  Problem List:  Patient Active Problem List  Diagnoses  . HYPOTHYROIDISM  . DIABETES MELLITUS, TYPE II  . HYPERLIPIDEMIA  . ANEMIA  . ANXIETY, CHRONIC  . ALZHEIMER'S DISEASE  . ALLERGIC RHINITIS  . GERD  . COLONIC POLYPS, HX OF  . VITAMIN D DEFICIENCY  . Failure to thrive in adult  . ARF (acute renal failure)  . UTI (lower urinary tract infection)  . Hyperglycemia    Past Medical History:  Past Medical History  Diagnosis Date  . Allergy   . GERD (gastroesophageal reflux disease)   . Chronic kidney disease     stones  . Ulcer   . Hyperlipidemia   . Hx of colonic polyps   . Arrhythmia   . Diabetes mellitus    Past Surgical History:  Past Surgical History  Procedure Date  . Tonsillectomy     PT Assessment/Plan/Recommendation PT Assessment Clinical Impression Statement: Pt is 77 yo female with Alzheimers dementia who lives with her husband and came to [facility] with poor po intake and increased confusion.  Pt's mobility is limited by her cognitive status as well as her activity tolerance.  Though she has [facility]: [facility] Rhonda W one-time strength on MMT, her functional strength is decreased as she fatigues quickly and has underlying balance deficits.  Recommend acute PT to help improve balance and functional strength and recommend HHPT to continue with this after her hospitalization.  Also rec HHaide to increase safety with ADL's and decrease caregiver burden of husband. PT Recommendation/Assessment: Patient will need skilled PT in the acute care venue PT Problem List: Decreased activity tolerance;Decreased balance;Decreased cognition;Decreased safety awareness;Decreased knowledge of precautions Barriers to Discharge: None PT Therapy Diagnosis : Altered mental status;Difficulty walking PT Plan PT Frequency: Min 3X/week PT Treatment/Interventions: DME instruction;Gait  training;Stair training;Functional mobility training;Therapeutic exercise;Balance training;Patient/family education PT Recommendation Recommendations for Other Services: OT consult Follow Up Recommendations: Home health PT;24 hour supervision/assistance;Other (comment) (HHaide) Equipment Recommended: None recommended by PT PT Goals  Acute Rehab PT Goals PT Goal Formulation: With patient/family Time For Goal Achievement: 2 weeks Pt will go Supine/Side to Sit: with modified independence PT Goal: Supine/Side to Sit - Progress: Progressing toward goal Pt will go Sit to Supine/Side: with modified independence PT Goal: Sit to Supine/Side - Progress: Progressing toward goal Pt will Transfer Sit to Stand/Stand to Sit: with supervision PT Transfer Goal: Sit to Stand/Stand to Sit - Progress: Progressing toward goal Pt will Transfer Bed to Chair/Chair to Bed: with supervision PT Transfer Goal: Bed to Chair/Chair to Bed - Progress: Progressing toward goal Pt will Ambulate: >150 feet;with least restrictive assistive device;with supervision PT Goal: Ambulate - Progress: Progressing toward goal Pt will Go Up / Down Stairs: 1-2 stairs;with rail(s);with supervision PT Goal: Up/Down Stairs - Progress: Not met Pt will Perform Home Exercise Program: with supervision, verbal cues required/provided PT Goal: Perform Home Exercise Program - Progress: Other (comment) (not assessed this session)  PT Evaluation Precautions/Restrictions  Precautions Precautions: Fall;Other (comment) (DNR) Required Braces or Orthoses: No Restrictions Weight Bearing Restrictions: No Other Position/Activity Restrictions: pt keeps pulling at IV (obtained an activity vest for pt at end of session) Prior Functioning  Home Living Lives With: Spouse Receives Help From: Family Type of Home: House Home Layout: Two level Alternate Level Stairs-Rails: Left (also has a RW sitting at step for support) Alternate Level Stairs-Number of  Steps: 2 (sunken living room) Home Access: Stairs to enter  Entrance Stairs-Rails: Right Entrance Stairs-Number of Steps: 2 Home Adaptive Equipment: Walker - rolling Prior Function Level of Independence: Independent with transfers;Needs assistance with gait;Needs assistance with ADLs (supervision with gait) Bath: Minimal Able to Take Stairs?: Yes Driving: No Vocation: Retired Producer, television/film/video: Awake/alert Overall Cognitive Status: Impaired Memory: Appears impaired Memory Deficits: no STM Orientation Level: Oriented to person;Disoriented to place;Disoriented to time;Disoriented to situation Following Commands: Follows one step commands inconsistently Safety/Judgement: Decreased awareness of safety precautions;Decreased safety judgement for tasks assessed Decreased Safety/Judgement: Decreased awareness of need for assistance Safety/Judgement - Other Comments: pt does not use RW at home and if she happens to use it, she picks it up and carries it, per husband.  Furniture/ wall walks at home.  Last fall about 4 mos ago. Awareness of Errors: Decreased awareness of errors made Decreased Awareness of Errors: Assistance required to identify errors made;Assistance required to correct errors made Awareness of Errors - Other Comments: does not realize what her IV line is and that she should not be pulling on it Awareness of Deficits: Decreased awareness of deficits Awareness of Deficits - Other Comments: has no idea why she is not at home Problem Solving: Requires assistance for problem solving Sensation/Coordination Sensation Light Touch: Appears Intact Stereognosis: Appears Intact Hot/Cold: Not tested Proprioception: Appears Intact Coordination Gross Motor Movements are Fluid and Coordinated: No (partly due to difficulty following commands & problem solvin) Fine Motor Movements are Fluid and Coordinated: Yes Coordination and Movement Description: pt's automatic  responses are coordinated but mvmt becomes uncoordinated when pt becomes confused over the situation or what she is being asked to do Extremity Assessment RUE Assessment RUE Assessment: Within Functional Limits LUE Assessment LUE Assessment: Within Functional Limits RLE Assessment RLE Assessment: Within Functional Limits LLE Assessment LLE Assessment: Within Functional Limits Note: though pt demonstrates [facility] strength with individual mvmts, she shows poor functional strength in that she fatigues quickly with activity as evidenced by decreased safety with ambulation, increased difficulty ambulating and more assist needed, and poor gait pattern Mobility (including Balance) Bed Mobility Bed Mobility: Yes Supine to Sit: 5: Supervision Supine to Sit Details (indicate cue type and reason): verbal cues to perform task and increased time to understand what to do.  After command gievn, further vc's confuse pt more Sitting - Scoot to [place] of Bed: 5: Supervision Sitting - Scoot to Edge of Bed Details (indicate cue type and reason): able to scoot EOB without difficulty Sit to Supine - Left: 4: Min assist;HOB flat Sit to Supine - Left Details (indicate cue type and reason): min A to initiate mvmt Transfers Transfers: Yes Sit to Stand: 4: Min assist Sit to Stand Details (indicate cue type and reason): min A for safety, pt with sufficient strength and coordination to perform mvmt Stand to Sit: 5: Supervision Ambulation/Gait Ambulation/Gait: Yes Ambulation/Gait Assistance: 4: Min assist Ambulation/Gait Assistance Details (indicate cue type and reason): HHA given as pt reaching for stable objects (foot of bed, wall). Pt required less assist at beginning of walk and began relying more on HHA with distance.  Also began to shuffle feet, decrease cadence, and shorten step length with distance Ambulation Distance (Feet): 200 Feet Assistive device: 1 person hand held assist Gait Pattern: Shuffle Gait  velocity: decreased, progressive with distance Stairs: No Wheelchair Mobility Wheelchair Mobility: No  Posture/Postural Control Posture/Postural Control: No significant limitations Balance Balance Assessed: Yes Dynamic Standing Balance Dynamic Standing - Level of Assistance: 4: Min assist    End of Session PT -  End of Session Equipment Utilized During Treatment: Gait belt Activity Tolerance: Patient limited by fatigue Patient left: in bed;with call bell in reach;with family/visitor present General Behavior During Session: [facility] for tasks performed Cognition: Impaired, at baseline  Stephenie Navejas, [place] 02/20/2011, 4:17 PM

## 2011-02-20 NOTE — ED Provider Notes (Signed)
Medical screening examination/treatment/procedure(s) were performed by non-physician practitioner and as supervising physician I was immediately available for consultation/collaboration.   Geoffery Lyons, MD 02/20/11 [phone]

## 2011-02-20 NOTE — Plan of Care (Signed)
Problem: Consults Goal: Diabetes Guidelines if Diabetic/Glucose > 140 If diabetic or lab glucose is > 140 mg/dl - Initiate Diabetes/Hyperglycemia Guidelines & Document Interventions  Outcome: Progressing Insulin starter kit ordered for patient.  Please teach husband how to administer insulin on patient.

## 2011-02-20 NOTE — Progress Notes (Signed)
Colleen Collins UJW:[id number],FAO:[id number] is a 77 y.o. female,  Outpatient Primary MD for the patient is [name], MD Chief Complaint  Patient presents with  . Hyperglycemia   02/20/2011    Assessment & Plan   Principal Problem:  1. Failure to thrive in adult with Advanced Dementia - likely due to mild Acute Bronchitis her history and lack of sleep over the last 4-5 days at home, trail of PO Doxy, gently QHS PRN benadryl per husband request, Nutrition, Speech -PT input. Outpt dementia Follow (did not tolerate Aricept before).  2. HYPOTHYROIDISM -  Continue home dose Synthroid  Lab Results  Component Value Date   TSH 3.218 02/03/2011     3. DIABETES MELLITUS, TYPE II - poor control,  On PO Meds at Home, ISS added, Insulin education to Husband as may need Lantus + ISS  Lab Results  Component Value Date   HGBA1C 10.7* 02/03/2011    4.  ARF - gentle hydration and monitor. Almost resolved.  DVT Prophylaxis SCDs  See all Orders from today for further details     Subjective:   Colleen Collins today has, No headache, No chest pain, No abdominal pain - No Nausea, No new weakness tingling or numbness, ++ dry Cough, No SOB.    Objective:   Vital signs in last 24 hours:  Filed Vitals:   02/19/11 2344 02/20/11 0216 02/20/11 0240 02/20/11 0501  BP: 143/81 133/77 145/70 140/65  Pulse: 76 71 73 70  Temp: 98.5 F (36.9 C) 98.9 F (37.2 C) 98.9 F (37.2 C) 98.6 F (37 C)  TempSrc: Oral Oral Axillary Oral  Resp: 16  18 18   Height:   5\' 1"  (1.549 m)   Weight:   44.4 kg (97 lb 14.2 oz)   SpO2: 97% 95% 97% 96%    Intake/Output from previous day:   Intake/Output Summary (Last 24 hours) at 02/20/11 0853 Last data filed at 02/20/11 0600  Gross per 24 hour  Intake    425 ml  Output      0 ml  Net    425 ml   Exam Awake Alert, Oriented X 0, No new F.N deficits, Normal affect [place].AT,PERRAL Supple Neck,No JVD, No cervical lymphadenopathy appriciated.  Symmetrical Chest  wall movement, Good air movement bilaterally, CTAB RRR,No Gallops,Rubs or new Murmurs, No Parasternal Heave +ve B.Sounds, Abd Soft, Non tender, No organomegaly appriciated, No rebound -guarding or rigidity. No Cyanosis, Clubbing or edema, No new Rash or bruise   Data Review   Radiology Reports  Dg Chest 2 View  02/19/2011  *RADIOLOGY REPORT*  Clinical Data: 77 year old female with cough.  CHEST - 2 VIEW  Comparison: 05/21/2010 and earlier.  Findings: Seated AP and lateral views of the chest.  Lower lung volumes.  Cardiac size and mediastinal contours are within normal limits.  No pneumothorax, pulmonary edema, pleural effusion or acute pulmonary opacity.  Right upper quadrant surgical clips. No acute osseous abnormality identified.  IMPRESSION: No acute cardiopulmonary abnormality.  Original Report Authenticated By: Harley Hallmark, M.D.   Lab Results:  Micro Results: No results found for this or any previous visit (from the past 240 hour(s)).   Basename 02/20/11 0705 02/19/11 2119  NA 140 140  K 3.7 4.1  CL 105 103  CO2 27 --  GLUCOSE 223* 249*  BUN 25* 32*  CREATININE 0.92 1.40*  CALCIUM 9.2 --  MG -- --  PHOS -- --   No results found for this basename: AST:2,ALT:2,ALKPHOS:2,BILITOT:2,PROT:2,ALBUMIN:2 in the  last 72 hours No results found for this basename: LIPASE:2,AMYLASE:2 in the last 72 hours  Basename 02/20/11 0705 02/19/11 2119 02/19/11 2056  WBC 9.4 -- 6.0  NEUTROABS -- -- --  HGB 12.0 12.2 --  HCT 35.6* 36.0 --  MCV 97.8 -- 97.3  PLT 241 -- 253   Medications: Scheduled Meds:   . cefTRIAXone  1 g Intramuscular Once  . donepezil  23 mg Oral QHS  . doxycycline  100 mg Oral Q12H  . enoxaparin  40 mg Subcutaneous Q24H  . glyBURIDE  5 mg Oral Q breakfast  . insulin aspart  0-5 Units Subcutaneous QHS  . insulin aspart  0-9 Units Subcutaneous TID WC  . levothyroxine  75 mcg Oral QAC breakfast  . lidocaine      . simvastatin  40 mg Oral QHS  . sodium chloride   250 mL Intravenous Once  . DISCONTD: sodium chloride   Intravenous Once  . DISCONTD: sodium chloride   Intravenous Once  . DISCONTD: cefTRIAXone (ROCEPHIN) IV  1 g Intravenous Q24H   Continuous Infusions:   . sodium chloride 75 mL/hr at 02/20/11 0506   PRN Meds:.acetaminophen, acetaminophen, alum & mag hydroxide-simeth, diphenhydrAMINE, HYDROmorphone, ondansetron (ZOFRAN) IV, ondansetron, oxyCODONE, senna, traZODone

## 2011-02-20 NOTE — Progress Notes (Signed)
Spoke w pt and husband. Husband is main caregiver but he does have godson and his wife and 3 children that do help some. Will give husb sitter priv duty list and hhc agency list. sw also to see as husb states is getting tired.

## 2011-02-21 LAB — GLUCOSE, CAPILLARY
Glucose-Capillary: 163 mg/dL — ABNORMAL HIGH (ref 70–99)
Glucose-Capillary: 168 mg/dL — ABNORMAL HIGH (ref 70–99)

## 2011-02-21 LAB — URINE CULTURE
Colony Count: NO GROWTH
Culture: NO GROWTH

## 2011-02-21 MED ORDER — KCL IN DEXTROSE-NACL 20-5-0.9 MEQ/L-%-% IV SOLN: INTRAVENOUS | Status: DC

## 2011-02-21 MED ORDER — SODIUM CHLORIDE 0.9 % IV SOLN
INTRAVENOUS | Status: AC
  Administered 2011-02-21: via INTRAVENOUS

## 2011-02-21 MED ORDER — MEGESTROL ACETATE 400 MG/10ML PO SUSP
400.0000 mg | Freq: Every day | ORAL | Status: DC
  Administered 2011-02-21 – 2011-02-24 (×2): 400 mg via ORAL
  Filled 2011-02-21 (×4): qty 10

## 2011-02-21 NOTE — Progress Notes (Signed)
Physical Therapy Treatment Patient Details Name: Colleen Collins MRN: [id number] DOB: 27-Sep-1922 Today's Date: 02/21/2011  PT Assessment/Plan  PT - Assessment/Plan Comments on Treatment Session: Pt tolerate treatment well, had improved stability with RW as compared to without RW. Will recommend one for D/C to decrease risk for falls.  PT Plan: Discharge plan remains appropriate PT Frequency: Min 3X/week Follow Up Recommendations: Home health PT;24 hour supervision/assistance (HHaide) Equipment Recommended: Rolling walker with 5" wheels PT Goals  Acute Rehab PT Goals PT Goal Formulation: With patient/family Time For Goal Achievement: 2 weeks Pt will go Supine/Side to Sit: with modified independence PT Goal: Supine/Side to Sit - Progress: Progressing toward goal Pt will go Sit to Supine/Side: with modified independence PT Goal: Sit to Supine/Side - Progress: Progressing toward goal Pt will Transfer Sit to Stand/Stand to Sit: with supervision PT Transfer Goal: Sit to Stand/Stand to Sit - Progress: Progressing toward goal Pt will Transfer Bed to Chair/Chair to Bed: with supervision PT Transfer Goal: Bed to Chair/Chair to Bed - Progress: Progressing toward goal Pt will Ambulate: >150 feet;with least restrictive assistive device;with supervision PT Goal: Ambulate - Progress: Progressing toward goal Pt will Go Up / Down Stairs: 1-2 stairs;with rail(s);with supervision PT Goal: Up/Down Stairs - Progress: Progressing toward goal Pt will Perform Home Exercise Program: with supervision, verbal cues required/provided PT Goal: Perform Home Exercise Program - Progress: Progressing toward goal  PT Treatment Precautions/Restrictions  Precautions Precautions: Fall;Other (comment) (DNR) Required Braces or Orthoses: No Restrictions Weight Bearing Restrictions: No Other Position/Activity Restrictions: pt keeps pulling at IV (obtained an activity vest for pt at end of session) Mobility (including  Balance) Bed Mobility Bed Mobility: Yes Supine to Sit: 4: Min assist Supine to Sit Details (indicate cue type and reason): Verbal/tactile  cues for lower extremity advancement and use of rail.   Sitting - Scoot to [place] of Bed: 4: Min assist Sitting - Scoot to [place] of Bed Details (indicate cue type and reason): assist with pad to advance pelvis Sit to Supine - Left: 4: Min assist;HOB flat Transfers Transfers: Yes Sit to Stand: 4: Min assist Sit to Stand Details (indicate cue type and reason): for safety and minimal weakness Stand to Sit: 5: Supervision Stand to Sit Details: Repeated verbal cues to use arm rests to control descent Ambulation/Gait Ambulation/Gait: Yes Ambulation/Gait Assistance: 4: Min assist Ambulation/Gait Assistance Details (indicate cue type and reason): Min assist for lateral sway Ambulation Distance (Feet): 200 Feet Assistive device: Rolling walker Gait Pattern: Shuffle;Trunk flexed (some tendency to have narrow/cross over stepping) Gait velocity: decreased speed Stairs: No Wheelchair Mobility Wheelchair Mobility: No    Exercise  General Exercises - Lower Extremity Ankle Circles/Pumps: AROM;Strengthening;Both;20 reps;Seated Long Arc Quad: AROM;Strengthening;Both;20 reps;Seated End of Session PT - End of Session Equipment Utilized During Treatment: Gait belt Activity Tolerance: Patient limited by fatigue Patient left: in bed;with call bell in reach;with family/visitor present General Behavior During Session: [facility] for tasks performed Cognition: Impaired, at baseline  Ellin Saba) [id number] 02/21/2011, 4:15 PM

## 2011-02-21 NOTE — Progress Notes (Signed)
CSW completed assessment and placed full assessment in shadow chart. CSW met with family friend/caregiver and patient who reported patient has 24 hour care and will return home at dc. CSW provided pt with ALF list and referral to [facility] who can follow up after hospitalization if desired by patient. CSW is signing off but is available if needed.

## 2011-02-21 NOTE — Progress Notes (Addendum)
Colleen Collins [id number]:[id number] is a 77 y.o. female,  Outpatient Primary MD for the patient is [name], MD Chief Complaint  Patient presents with  . Hyperglycemia   02/21/2011    Assessment & Plan   Principal Problem:  1. Failure to thrive in adult with Advanced Dementia - likely due to mild Acute Bronchitis her history and lack of sleep over the last 4-5 days at home, trail of PO Doxy X 7 days, continue gentle QHS PRN benadryl per husband request for sleep, Nutrition poor, noted Speech input. PT called, Outpt dementia Follow (did not tolerate Aricept before, husband requests re trail will do).  2. HYPOTHYROIDISM -  Continue home dose Synthroid  Lab Results  Component Value Date   TSH 3.218 02/03/2011     3. DIABETES MELLITUS, TYPE II - poor control,  On PO Meds at Home, lantus & ISS added, Insulin education to Husband. Better control.  CBG (last 3)   Basename 02/21/11 0757 02/20/11 2206 02/20/11 1641  GLUCAP 162* 168* 177*   Lab Results  Component Value Date   HGBA1C 10.1* 02/20/2011    4.  ARF - gentle hydration and monitor. Resolved.  Lab Results  Component Value Date   CREATININE 0.92 02/20/2011     5.Poor Po intake - D/W husband, gentle IVF another 24hrs, added 5 days of Megace (lovenox), if no improvement - consider Pall care. DNR now. D/W Husband 02-20-11  DVT Prophylaxis SCDs , Lovenox  Lab Results  Component Value Date   PLT 241 02/20/2011   See all Orders from today for further details     Subjective:   [name] today has, No headache, No chest pain, No abdominal pain - No Nausea, No new weakness tingling or numbness, ++ dry Cough, No SOB.    Objective:   Vital signs in last 24 hours:  Filed Vitals:   02/20/11 1300 02/20/11 2200 02/21/11 0206 02/21/11 0617  BP: 110/68 146/74 131/57 118/54  Pulse: 78 83 67 60  Temp: 99 F (37.2 C) 97.4 F (36.3 C) 97.3 F (36.3 C) 96.4 F (35.8 C)  TempSrc: Axillary Oral Axillary  Axillary  Resp: 18 20 20 20   Height:      Weight:      SpO2: 95% 94% 96% 94%    Intake/Output from previous day:   Intake/Output Summary (Last 24 hours) at 02/21/11 0816 Last data filed at 02/20/11 1700  Gross per 24 hour  Intake    675 ml  Output    575 ml  Net    100 ml   Exam Awake Alert, Oriented X 0, No new F.N deficits, Normal affect [place].AT,PERRAL Supple Neck,No JVD, No cervical lymphadenopathy appriciated.  Symmetrical Chest wall movement, Good air movement bilaterally, CTAB RRR,No Gallops,Rubs or new Murmurs, No Parasternal Heave +ve B.Sounds, Abd Soft, Non tender, No organomegaly appriciated, No rebound -guarding or rigidity. No Cyanosis, Clubbing or edema, No new Rash or bruise   Data Review   Radiology Reports  Dg Chest 2 View  02/19/2011  *RADIOLOGY REPORT*  Clinical Data: 77 year old female with cough.  CHEST - 2 VIEW  Comparison: 05/21/2010 and earlier.  Findings: Seated AP and lateral views of the chest.  Lower lung volumes.  Cardiac size and mediastinal contours are within normal limits.  No pneumothorax, pulmonary edema, pleural effusion or acute pulmonary opacity.  Right upper quadrant surgical clips. No acute osseous abnormality identified.  IMPRESSION: No acute cardiopulmonary abnormality.  Original Report Authenticated By: Ulla Potash III,  M.D.   Lab Results:  Micro Results:  UA stable, follow Cultures    [facility] 02/20/11 0705 02/19/11 2119  NA 140 140  K 3.7 4.1  CL 105 103  CO2 27 --  GLUCOSE 223* 249*  BUN 25* 32*  CREATININE 0.92 1.40*  CALCIUM 9.2 --  MG -- --  PHOS -- --   No results found for this basename: AST:2,ALT:2,ALKPHOS:2,BILITOT:2,PROT:2,ALBUMIN:2 in the last 72 hours No results found for this basename: LIPASE:2,AMYLASE:2 in the last 72 hours  Basename 02/20/11 0705 02/19/11 2119 02/19/11 2056  WBC 9.4 -- 6.0  NEUTROABS -- -- --  HGB 12.0 12.2 --  HCT 35.6* 36.0 --  MCV 97.8 -- 97.3  PLT 241 -- 253    Medications: Scheduled Meds:    . donepezil  23 mg Oral QHS  . doxycycline  100 mg Oral Q12H  . enoxaparin  40 mg Subcutaneous Q24H  . Flexpen Starter Kit  1 kit Other Once  . glyBURIDE  5 mg Oral Q breakfast  . insulin aspart  0-5 Units Subcutaneous QHS  . insulin aspart  0-9 Units Subcutaneous TID WC  . insulin glargine  8 Units Subcutaneous QPC breakfast  . levothyroxine  75 mcg Oral QAC breakfast  . megestrol  400 mg Oral Daily  . simvastatin  40 mg Oral QHS  . DISCONTD: cefTRIAXone (ROCEPHIN) IV  1 g Intravenous Q24H   Continuous Infusions:    . sodium chloride    . DISCONTD: sodium chloride 75 mL/hr at 02/21/11 0512  . DISCONTD: dextrose 5 % and 0.9 % NaCl with KCl 20 mEq/L     PRN Meds:.acetaminophen, acetaminophen, alum & mag hydroxide-simeth, diphenhydrAMINE, ondansetron (ZOFRAN) IV, ondansetron, oxyCODONE, senna, traZODone, DISCONTD: HYDROmorphone

## 2011-02-22 LAB — BASIC METABOLIC PANEL
BUN: 11 mg/dL (ref 6–23)
CO2: 24 mEq/L (ref 19–32)
Calcium: 9 mg/dL (ref 8.4–10.5)
Chloride: 110 mEq/L (ref 96–112)
Creatinine, Ser: 0.7 mg/dL (ref 0.50–1.10)
GFR calc Af Amer: 88 mL/min — ABNORMAL LOW (ref >90)

## 2011-02-22 LAB — GLUCOSE, CAPILLARY
Glucose-Capillary: 168 mg/dL — ABNORMAL HIGH (ref 70–99)
Glucose-Capillary: 134 mg/dL — ABNORMAL HIGH (ref 70–99)

## 2011-02-22 MED ORDER — POTASSIUM CHLORIDE CRYS ER 20 MEQ PO TBCR
EXTENDED_RELEASE_TABLET | ORAL | Status: AC
  Administered 2011-02-22: 40 meq via ORAL
  Filled 2011-02-22: qty 2

## 2011-02-22 MED ORDER — POTASSIUM CHLORIDE CRYS ER 20 MEQ PO TBCR
40.0000 meq | EXTENDED_RELEASE_TABLET | Freq: Once | ORAL | Status: AC
  Administered 2011-02-22 (×2): 40 meq via ORAL

## 2011-02-22 NOTE — Progress Notes (Signed)
Subjective: DENIES ANY NEW COMPLAINTS.  Objective: Vital signs in last 24 hours: Filed Vitals:   02/21/11 1928 02/22/11 0200 02/22/11 0500 02/22/11 1300  BP: 134/66 136/16 140/82 108/46  Pulse: 68 72 82 89  Temp: 97.1 F (36.2 C) 98.2 F (36.8 C) 98.2 F (36.8 C) 98.3 F (36.8 C)  TempSrc:  Oral Oral Axillary  Resp: 14 18 18 18   Height:      Weight:      SpO2: 97% 98% 98% 97%   Weight change:   Intake/Output Summary (Last 24 hours) at 02/22/11 1723 Last data filed at 02/22/11 1300  Gross per 24 hour  Intake     10 ml  Output      2 ml  Net      8 ml   Physical Exam:   General Appearance:    Alert, cooperative, no distress, appears stated age  Lungs:     Clear to auscultation bilaterally, respirations unlabored   Heart:    Regular rate and rhythm, S1 and S2 normal, no murmur, rub   or gallop  Abdomen:     Soft, non-tender, bowel sounds active all four quadrants,    no masses, no organomegaly  Extremities:   Extremities normal, atraumatic, no cyanosis or edema     Lab Results: Results for orders placed during the hospital encounter of 02/19/11 (from the past 24 hour(s))  GLUCOSE, CAPILLARY     Status: Abnormal   Collection Time   02/21/11 11:21 PM      Component Value Range   Glucose-Capillary 168 (*) 70 - 99 (mg/dL)   Comment 1 Notify RN     Comment 2 Documented in Chart    BASIC METABOLIC PANEL     Status: Abnormal   Collection Time   02/22/11  7:40 AM      Component Value Range   Sodium 142  135 - 145 (mEq/L)   Potassium 3.4 (*) 3.5 - 5.1 (mEq/L)   Chloride 110  96 - 112 (mEq/L)   CO2 24  19 - 32 (mEq/L)   Glucose, Bld 169 (*) 70 - 99 (mg/dL)   BUN 11  6 - 23 (mg/dL)   Creatinine, Ser 0.98  0.50 - 1.10 (mg/dL)   Calcium 9.0  8.4 - 11.9 (mg/dL)   GFR calc non Af Amer 76 (*) >90 (mL/min)   GFR calc Af Amer 88 (*) >90 (mL/min)  MAGNESIUM     Status: Normal   Collection Time   02/22/11  7:40 AM      Component Value Range   Magnesium 1.5  1.5 - 2.5  (mg/dL)  PHOSPHORUS     Status: Normal   Collection Time   02/22/11  7:40 AM      Component Value Range   Phosphorus 3.1  2.3 - 4.6 (mg/dL)  GLUCOSE, CAPILLARY     Status: Abnormal   Collection Time   02/22/11  8:40 AM      Component Value Range   Glucose-Capillary 168 (*) 70 - 99 (mg/dL)  GLUCOSE, CAPILLARY     Status: Abnormal   Collection Time   02/22/11 12:28 PM      Component Value Range   Glucose-Capillary 134 (*) 70 - 99 (mg/dL)    Micro Results: Recent Results (from the past 240 hour(s))  URINE CULTURE     Status: Normal   Collection Time   02/19/11  9:50 PM      Component Value Range Status Comment  Specimen Description URINE, RANDOM   Final    Special Requests PATIENT ON FOLLOWING ROCEPHIN ADDED 0418 02/20/11   Final    Setup Time [id number]   Final    Colony Count NO GROWTH   Final    Culture NO GROWTH   Final    Report Status 02/21/2011 FINAL   Final    Studies/Results: No results found. Medications:REVIEWED.  Scheduled Meds:   . donepezil  23 mg Oral QHS  . doxycycline  100 mg Oral Q12H  . enoxaparin  40 mg Subcutaneous Q24H  . glyBURIDE  5 mg Oral Q breakfast  . insulin aspart  0-5 Units Subcutaneous QHS  . insulin aspart  0-9 Units Subcutaneous TID WC  . insulin glargine  8 Units Subcutaneous QPC breakfast  . levothyroxine  75 mcg Oral QAC breakfast  . megestrol  400 mg Oral Daily  . simvastatin  40 mg Oral QHS   Continuous Infusions:   . sodium chloride 50 mL/hr at 02/21/11 2336   PRN Meds:.acetaminophen, acetaminophen, alum & mag hydroxide-simeth, ondansetron (ZOFRAN) IV, ondansetron, oxyCODONE, senna, traZODone, DISCONTD: diphenhydrAMINE Assessment/Plan: Principal Problem:  *Failure to thrive in adult IMPROVING ON MEGACE Active Problems:  HYPOTHYROIDISM STABLE   DIABETES MELLITUS, TYPE II STABLE.   ARF (acute renal failure) RESOLVED.  URI ON dOXYCYCLINE.  DISPOSITION: POSSIBLE D/C IN AM.     LOS: 3 days   Joziah Dollins 02/22/2011,  5:23 PM

## 2011-02-22 NOTE — Progress Notes (Signed)
Spouse plans to take pt home with home health, list of agencies provided and placed in chart.  Referral made per choice.  HOME HEALTH AGENCIES SERVING [place]   Agencies that are Medicare-Certified and are affiliated with [facility]  Telephone Number Address  [facility]   The [facility] has ownership interest in this company; however, you are under no obligation to use this agency. [phone] or  [phone] [address] [place], [place] [address]   Agencies that are Medicare-Certified and are not affiliated with [facility] Telephone Number Address  [facility] [phone] Fax [phone] [address] [place], [place]  [address]  [facility] [phone] or [phone] Fax [phone] [address] [place], [place] [address]  [facility] [phone] Fax [phone] [address] [place], [place] [address]  [facility] [phone] Fax [phone] [address] [place], [place]  [address]  [facility] [phone] Fax [phone] [address], [address] [place], [place] [address]  [facility] [phone] [address] [place], [place] [address]  [facility] [phone]  [address] [place], [place] [address]  [facility] [phone] or [phone] Fax [phone] [address] [place], [place]  [address]  [facility] [phone] Fax [phone] [address] [place], [place]  [address]  [facility]  [phone] Fax [phone] [address] [place], [place] [address]

## 2011-02-22 NOTE — Plan of Care (Signed)
Problem: Alteration in thought process Goal: Anxiety is at manageable level Document effectiveness of psychoactive medications, if any, prescribed. Open shades during the day.  Provide quiet environment during the night. Attempt to lessen anxiety by use of empathic techniques and soothing voice. Goal: Ambulates safely using assistive device PT eval and treat as per therapist.  Review therapy notes daily for recommendations. Pt. Responds to touch therapy. Goal: Patient will remain free of physical injury Pt. To have private sitter and/or family members at bedside throughout length of hospital stay. Pt. To be in camera room. Bed alarm in place. Red socks on. Yellow armband on.

## 2011-02-23 ENCOUNTER — Inpatient Hospital Stay (HOSPITAL_COMMUNITY): Payer: Medicare Other

## 2011-02-23 LAB — GLUCOSE, CAPILLARY: Glucose-Capillary: 121 mg/dL — ABNORMAL HIGH (ref 70–99)

## 2011-02-23 LAB — BASIC METABOLIC PANEL
BUN: 15 mg/dL (ref 6–23)
Calcium: 8.9 mg/dL (ref 8.4–10.5)
Creatinine, Ser: 0.71 mg/dL (ref 0.50–1.10)
GFR calc Af Amer: 88 mL/min — ABNORMAL LOW (ref >90)
GFR calc non Af Amer: 76 mL/min — ABNORMAL LOW (ref >90)

## 2011-02-23 MED ORDER — ENOXAPARIN SODIUM 30 MG/0.3ML ~~LOC~~ SOLN
30.0000 mg | SUBCUTANEOUS | Status: DC
  Administered 2011-02-24: 30 mg via SUBCUTANEOUS
  Filled 2011-02-23: qty 0.3

## 2011-02-23 MED ORDER — POTASSIUM CHLORIDE 20 MEQ/15ML (10%) PO LIQD
40.0000 meq | Freq: Two times a day (BID) | ORAL | Status: AC
  Administered 2011-02-23 – 2011-02-24 (×2): 40 meq via ORAL
  Filled 2011-02-23 (×3): qty 30

## 2011-02-23 NOTE — Progress Notes (Signed)
Subjective: Comfortable, . Overnight pt vomited once. Spoke to pt's husband and god son at bedside and answered all their questions.  Objective: Vital signs in last 24 hours: Filed Vitals:   02/22/11 1300 02/22/11 2100 02/23/11 0500 02/23/11 1350  BP: 108/46 104/58 124/72 128/62  Pulse: 89 58 59 59  Temp: 98.3 F (36.8 C) 99.5 F (37.5 C) 97.4 F (36.3 C) 98.2 F (36.8 C)  TempSrc: Axillary Oral Axillary Axillary  Resp: 18 20 20 18   Height:      Weight:      SpO2: 97% 97% 97% 99%   Weight change:   Intake/Output Summary (Last 24 hours) at 02/23/11 1916 Last data filed at 02/23/11 1842  Gross per 24 hour  Intake    520 ml  Output    100 ml  Net    420 ml   Physical Exam:   General Appearance:    Alert, cooperative, no distress, appears stated age  Lungs:     Clear to auscultation bilaterally, respirations unlabored   Heart:    Regular rate and rhythm, S1 and S2 normal,   Abdomen:     Soft, non-tender, bowel sounds active all four quadrants,     Extremities:   Extremities normal, atraumatic, no cyanosis or edema  Pulses:   2+ and symmetric all extremities           Lab Results: Results for orders placed during the hospital encounter of 02/19/11 (from the past 48 hour(s))  GLUCOSE, CAPILLARY     Status: Abnormal   Collection Time   02/21/11 11:21 PM      Component Value Range Comment   Glucose-Capillary 168 (*) 70 - 99 (mg/dL)    Comment 1 Notify RN      Comment 2 Documented in Chart     BASIC METABOLIC PANEL     Status: Abnormal   Collection Time   02/22/11  7:40 AM      Component Value Range Comment   Sodium 142  135 - 145 (mEq/L)    Potassium 3.4 (*) 3.5 - 5.1 (mEq/L)    Chloride 110  96 - 112 (mEq/L)    CO2 24  19 - 32 (mEq/L)    Glucose, Bld 169 (*) 70 - 99 (mg/dL)    BUN 11  6 - 23 (mg/dL)    Creatinine, Ser 2.13  0.50 - 1.10 (mg/dL)    Calcium 9.0  8.4 - 10.5 (mg/dL)    GFR calc non Af Amer 76 (*) >90 (mL/min)    GFR calc Af Amer 88 (*) >90  (mL/min)   MAGNESIUM     Status: Normal   Collection Time   02/22/11  7:40 AM      Component Value Range Comment   Magnesium 1.5  1.5 - 2.5 (mg/dL)   PHOSPHORUS     Status: Normal   Collection Time   02/22/11  7:40 AM      Component Value Range Comment   Phosphorus 3.1  2.3 - 4.6 (mg/dL)   GLUCOSE, CAPILLARY     Status: Abnormal   Collection Time   02/22/11  8:40 AM      Component Value Range Comment   Glucose-Capillary 168 (*) 70 - 99 (mg/dL)   GLUCOSE, CAPILLARY     Status: Abnormal   Collection Time   02/22/11 12:28 PM      Component Value Range Comment   Glucose-Capillary 134 (*) 70 - 99 (mg/dL)   GLUCOSE, CAPILLARY  Status: Abnormal   Collection Time   02/22/11  5:38 PM      Component Value Range Comment   Glucose-Capillary 258 (*) 70 - 99 (mg/dL)   GLUCOSE, CAPILLARY     Status: Normal   Collection Time   02/22/11 10:17 PM      Component Value Range Comment   Glucose-Capillary 96  70 - 99 (mg/dL)    Comment 1 Notify RN      Comment 2 Documented in Chart     BASIC METABOLIC PANEL     Status: Abnormal   Collection Time   02/23/11  6:15 AM      Component Value Range Comment   Sodium 144  135 - 145 (mEq/L)    Potassium 3.3 (*) 3.5 - 5.1 (mEq/L)    Chloride 111  96 - 112 (mEq/L)    CO2 25  19 - 32 (mEq/L)    Glucose, Bld 156 (*) 70 - 99 (mg/dL)    BUN 15  6 - 23 (mg/dL)    Creatinine, Ser 0.45  0.50 - 1.10 (mg/dL)    Calcium 8.9  8.4 - 10.5 (mg/dL)    GFR calc non Af Amer 76 (*) >90 (mL/min)    GFR calc Af Amer 88 (*) >90 (mL/min)   GLUCOSE, CAPILLARY     Status: Abnormal   Collection Time   02/23/11  8:20 AM      Component Value Range Comment   Glucose-Capillary 140 (*) 70 - 99 (mg/dL)    Comment 1 Notify RN     GLUCOSE, CAPILLARY     Status: Abnormal   Collection Time   02/23/11 12:27 PM      Component Value Range Comment   Glucose-Capillary 121 (*) 70 - 99 (mg/dL)    Comment 1 Notify RN     GLUCOSE, CAPILLARY     Status: Abnormal   Collection Time    02/23/11  5:36 PM      Component Value Range Comment   Glucose-Capillary 120 (*) 70 - 99 (mg/dL)    Comment 1 Documented in Chart      Comment 2 Notify RN       Micro Results: Recent Results (from the past 240 hour(s))  URINE CULTURE     Status: Normal   Collection Time   02/19/11  9:50 PM      Component Value Range Status Comment   Specimen Description URINE, RANDOM   Final    Special Requests PATIENT ON FOLLOWING ROCEPHIN ADDED 0418 02/20/11   Final    Setup Time [id number]   Final    Colony Count NO GROWTH   Final    Culture NO GROWTH   Final    Report Status 02/21/2011 FINAL   Final    Studies/Results: No results found. Medications: reviewed.  Scheduled Meds:   . donepezil  23 mg Oral QHS  . doxycycline  100 mg Oral Q12H  . enoxaparin  30 mg Subcutaneous Q24H  . glyBURIDE  5 mg Oral Q breakfast  . insulin aspart  0-5 Units Subcutaneous QHS  . insulin aspart  0-9 Units Subcutaneous TID WC  . insulin glargine  8 Units Subcutaneous QPC breakfast  . levothyroxine  75 mcg Oral QAC breakfast  . megestrol  400 mg Oral Daily  . simvastatin  40 mg Oral QHS  . DISCONTD: enoxaparin  40 mg Subcutaneous Q24H   Continuous Infusions:  PRN Meds:.acetaminophen, acetaminophen, alum & mag hydroxide-simeth, ondansetron (ZOFRAN) IV, ondansetron,  oxyCODONE, senna, traZODone Assessment/Plan: Principal Problem:  *Failure to thrive in adult: secondary to advanced dementia. Megace started two days ago,. Talked to the pt's husband and god son at bedside, they feel she might have a better appetite when she goes home with familiar surroundings.  They did not want to pursue palliative care services yet.   Vomiting once this am: on prn zofran. ? Gastroparesis, not a candidate for reglan secondary to age, and dementia.  Will get a abdominal series.  Active Problems:  HYPOTHYROIDISM on synthroid.  DIABETES MELLITUS, TYPE II cbg's well under 250's currently on 8 units of lantus and ssi.  ARF  (acute renal failure) resolved. Dementia on donepezil .  DNR. Disposition: Home with Home pt / rn.     LOS: 4 days   Tawni Melkonian 02/23/2011, 7:16 PM

## 2011-02-23 NOTE — Progress Notes (Signed)
Patient complained of nausea this pm  Patient husband reported she was nauseated and would not eat . zofran 4mg  iv given . Patient is now requesting a " whopper" from [employer] . Continue with plan of care. Colleen Collins

## 2011-02-23 NOTE — Progress Notes (Signed)
Utilization Review Completed.Aurel Nguyen T11/15/2012   

## 2011-02-24 LAB — GLUCOSE, CAPILLARY
Glucose-Capillary: 125 mg/dL — ABNORMAL HIGH (ref 70–99)
Glucose-Capillary: 112 mg/dL — ABNORMAL HIGH (ref 70–99)

## 2011-02-24 MED ORDER — DONEPEZIL HCL 23 MG PO TABS: 23.0000 mg | ORAL_TABLET | Freq: Every day | ORAL | Status: DC

## 2011-02-24 MED ORDER — SENNA 8.6 MG PO TABS: 2.0000 | ORAL_TABLET | Freq: Every day | ORAL | Status: DC | PRN

## 2011-02-24 MED ORDER — LEVOTHYROXINE SODIUM 75 MCG PO TABS: 75.0000 ug | ORAL_TABLET | Freq: Every day | ORAL | Status: DC

## 2011-02-24 MED ORDER — DOXYCYCLINE HYCLATE 100 MG PO TABS: 100.0000 mg | ORAL_TABLET | Freq: Two times a day (BID) | ORAL | Status: AC

## 2011-02-24 MED ORDER — HYDRALAZINE HCL 20 MG/ML IJ SOLN
5.0000 mg | Freq: Once | INTRAMUSCULAR | Status: AC
  Administered 2011-02-24: 5 mg via INTRAVENOUS
  Filled 2011-02-24: qty 0.25

## 2011-02-24 MED ORDER — POLYETHYLENE GLYCOL 3350 17 G PO PACK
17.0000 g | PACK | Freq: Every day | ORAL | Status: DC | PRN
  Filled 2011-02-24: qty 1

## 2011-02-24 MED ORDER — ALUM & MAG HYDROXIDE-SIMETH 200-200-20 MG/5ML PO SUSP: 30.0000 mL | Freq: Four times a day (QID) | ORAL | Status: AC | PRN

## 2011-02-24 MED ORDER — MEGESTROL ACETATE 400 MG/10ML PO SUSP: 400.0000 mg | Freq: Every day | ORAL | Status: DC

## 2011-02-24 MED ORDER — POLYETHYLENE GLYCOL 3350 17 G PO PACK: 17.0000 g | PACK | Freq: Every day | ORAL | Status: AC | PRN

## 2011-02-24 MED ORDER — ONDANSETRON HCL 4 MG PO TABS: 4.0000 mg | ORAL_TABLET | Freq: Four times a day (QID) | ORAL | Status: AC | PRN

## 2011-02-24 NOTE — Discharge Summary (Signed)
PATIENT DETAILS Name: Colleen Collins Age: 77 y.o. Sex: female Date of Birth: 1922/08/15 MRN: [id number]. Admit Date: 02/19/2011 Admitting Physician: Leroy Sea, MD NWG:[id number],VHQIO W, MD  PRIMARY DISCHARGE DIAGNOSIS:  Principal Problem:  *Failure to thrive in adult 2. Acute Bronchitis 3. Dementia 4.Hypokalemia 5. Hypothyroidism 6. Diabetes Mellitus 7. ACute Renal Failure 8.Constipation      PAST MEDICAL HISTORY: Past Medical History  Diagnosis Date  . Allergy   . GERD (gastroesophageal reflux disease)   . Chronic kidney disease     stones  . Ulcer   . Hyperlipidemia   . Hx of colonic polyps   . Arrhythmia   . Diabetes mellitus     DISCHARGE MEDICATIONS: Current Discharge Medication List    START taking these medications   Details  alum & mag hydroxide-simeth (MAALOX/MYLANTA) 200-200-20 MG/5ML suspension Take 30 mLs by mouth every 6 (six) hours as needed (dyspepsia). Qty: 355 mL, Refills: 0    doxycycline (VIBRA-TABS) 100 MG tablet Take 1 tablet (100 mg total) by mouth every 12 (twelve) hours. Qty: 6 tablet, Refills: 0    megestrol (MEGACE) 400 MG/10ML suspension Take 10 mLs (400 mg total) by mouth daily. Qty: 240 mL, Refills: 1    ondansetron (ZOFRAN) 4 MG tablet Take 1 tablet (4 mg total) by mouth every 6 (six) hours as needed for nausea. Qty: 20 tablet, Refills: 0    polyethylene glycol (MIRALAX / GLYCOLAX) packet Take 17 g by mouth daily as needed. Qty: 14 each, Refills: 0    senna (SENOKOT) 8.6 MG TABS Take 2 tablets (17.2 mg total) by mouth daily as needed. Qty: 120 each, Refills: 0      CONTINUE these medications which have CHANGED   Details  donepezil (ARICEPT) 23 MG TABS tablet Take 1 tablet (23 mg total) by mouth at bedtime. Qty: 90 tablet, Refills: 3    levothyroxine (LEVOXYL) 75 MCG tablet Take 1 tablet (75 mcg total) by mouth daily before breakfast. Qty: 30 tablet, Refills: 0      CONTINUE these medications which have NOT CHANGED     Details  glyBURIDE (DIABETA) 5 MG tablet Take 5 mg by mouth daily.     simvastatin (ZOCOR) 40 MG tablet Take 40 mg by mouth at bedtime.       STOP taking these medications     ACTOS 45 MG tablet      metFORMIN (GLUCOPHAGE) 1000 MG tablet      naproxen sodium (ANAPROX) 220 MG tablet      omeprazole (PRILOSEC) 20 MG capsule      sertraline (ZOLOFT) 50 MG tablet      temazepam (RESTORIL) 30 MG capsule          BRIEF HPI:  See H&P, Labs, Consult and Test reports for all details in brief, patient was admitted for decreased po intake, was found to be in acute renal failure, had acute bronchitis.   CONSULTATIONS:   none.  PERTINENT RADIOLOGIC STUDIES: Dg Chest 2 View  02/19/2011  *RADIOLOGY REPORT*  Clinical Data: 77 year old female with cough.  CHEST - 2 VIEW  Comparison: 05/21/2010 and earlier.  Findings: Seated AP and lateral views of the chest.  Lower lung volumes.  Cardiac size and mediastinal contours are within normal limits.  No pneumothorax, pulmonary edema, pleural effusion or acute pulmonary opacity.  Right upper quadrant surgical clips. No acute osseous abnormality identified.  IMPRESSION: No acute cardiopulmonary abnormality.  Original Report Authenticated By: Harley Hallmark, M.D.   Dg Abd  1 View  02/23/2011  *RADIOLOGY REPORT*  Clinical Data: Vomiting.  ABDOMEN - 1 VIEW  Comparison: None  Findings: There is moderate stool throughout the colon suggesting constipation.  No dilated small bowel loops to suggest obstruction. The soft tissue shadows are maintained.  No worrisome calcifications.  The bony structures are intact.  IMPRESSION: Moderate stool throughout the colon suggesting constipation.  Original Report Authenticated By: P. Loralie Champagne, M.D.     PERTINENT LAB RESULTS: CBC: No results found for this basename: WBC:2,HGB:2,HCT:2,PLT:2 in the last 72 hours CMET CMP     Component Value Date/Time   NA 144 02/23/2011 0615   K 3.3* 02/23/2011 0615   CL  111 02/23/2011 0615   CO2 25 02/23/2011 0615   GLUCOSE 156* 02/23/2011 0615   BUN 15 02/23/2011 0615   CREATININE 0.71 02/23/2011 0615   CALCIUM 8.9 02/23/2011 0615   PROT 7.9 05/21/2010 2150   ALBUMIN 4.0 05/21/2010 2150   AST 33 05/21/2010 2150   ALT 13 05/21/2010 2150   ALKPHOS 91 05/21/2010 2150   BILITOT 0.7 05/21/2010 2150   GFRNONAA 76* 02/23/2011 0615   GFRAA 88* 02/23/2011 0615    GFR Estimated Creatinine Clearance: 35.4 ml/min (by C-G formula based on Cr of 0.71). No results found for this basename: LIPASE:2,AMYLASE:2 in the last 72 hours No results found for this basename: CKTOTAL:3,CKMB:3,CKMBINDEX:3,TROPONINI:3 in the last 72 hours No results found for this basename: POCBNP:3 in the last 72 hours No results found for this basename: DDIMER:2 in the last 72 hours No results found for this basename: HGBA1C:2 in the last 72 hours No results found for this basename: CHOL:2,HDL:2,LDLCALC:2,TRIG:2,CHOLHDL:2,LDLDIRECT:2 in the last 72 hours No results found for this basename: TSH,T4TOTAL,FREET3,T3FREE,THYROIDAB in the last 72 hours No results found for this basename: VITAMINB12:2,FOLATE:2,FERRITIN:2,TIBC:2,IRON:2,RETICCTPCT:2 in the last 72 hours Coags: No results found for this basename: PT:2,INR:2 in the last 72 hours Microbiology: Recent Results (from the past 240 hour(s))  URINE CULTURE     Status: Normal   Collection Time   02/19/11  9:50 PM      Component Value Range Status Comment   Specimen Description URINE, RANDOM   Final    Special Requests PATIENT ON FOLLOWING ROCEPHIN ADDED 0418 02/20/11   Final    Setup Time [id number]   Final    Colony Count NO GROWTH   Final    Culture NO GROWTH   Final    Report Status 02/21/2011 FINAL   Final      BRIEF HOSPITAL COURSE:   Principal Problem:  *Failure to thrive in adult:  Most likely secondary to advanced dementia. Pt was started on megace and as per the pt's husband her appetite improved. Pt 's husband and God son  declined palliative care services at this time.   2. Acute Bronchitis on doxycycline, has 3 more days to complete the course 3. Dementia at baseline on aricept. 4.Hypokalemia: repleted with 80 meq of k 5. Hypothyroidism on synthroid 6. Diabetes Mellitus on glyburide.  7. ACute Renal Failure resolved 8.Constipation on senna  And miralax daily prn.         TODAY-DAY OF DISCHARGE:  Subjective:   Colleen Collins today denies any complaints so far. Pt husband at bedside denies any vomiting, or diarrhea. Ate 25% of her breakfast, and getting better, very much interested in going home.   Objective:   Blood pressure 149/66, pulse 63, temperature 97.2 F (36.2 C), temperature source Oral, resp. rate 18, height 5\' 1"  (1.549 m),  weight 44.4 kg (97 lb 14.2 oz), SpO2 94.00%.  Intake/Output Summary (Last 24 hours) at 02/24/11 1327 Last data filed at 02/23/11 1842  Gross per 24 hour  Intake      0 ml  Output     50 ml  Net    -50 ml    Exam Awake Alert, comfortable.  [place].AT,PERRAL Supple Neck,No JVD, No cervical lymphadenopathy appriciated.  Symmetrical Chest wall movement, Good air movement bilaterally, CTAB RRR,No Gallops,Rubs or new Murmurs, No Parasternal Heave +ve B.Sounds, Abd Soft, Non tender, No organomegaly appriciated, No rebound -guarding or rigidity. No Cyanosis, Clubbing or edema, No new Rash or bruise  DISPOSITION: Discharge pt home with hom health pt / rn.    DISCHARGE INSTRUCTIONS:    Follow-up Information    Follow up with BURCHETTE,BRUCE W in 1 week. (As needed)    Contact information:   [address] [place] [address] [phone]           Total Time spent on discharge equals 45 minutes.  SignedKathlen Mody 02/24/2011 1:27 PM

## 2011-02-24 NOTE — Discharge Planning (Signed)
Patient discharged home in stable condition. Husband and God-son verbalize understanding of all discharge instructions, including home medications and follow up appointments.

## 2011-02-24 NOTE — Progress Notes (Signed)
Physical Therapy Treatment Patient Details Name: Colleen Collins MRN: [id number] DOB: 1923-04-09 Today's Date: 02/24/2011  PT Assessment/Plan  PT - Assessment/Plan Comments on Treatment Session: Treatment session focusing on education of family about need for 24 hour supervision, use of RW in the home. Also re-addressed need for RW to decrease risk for falls. Case manager to address need for RW prior to D/C. Pt's husband declining ambulation today as pt beign discharged soon.  PT Plan: Discharge plan remains appropriate PT Frequency: Min 3X/week Follow Up Recommendations: Home health PT Equipment Recommended: Rolling walker with 5" wheels PT Goals  Acute Rehab PT Goals PT Goal Formulation: With patient/family Time For Goal Achievement: 2 weeks Pt will go Supine/Side to Sit: with modified independence PT Goal: Supine/Side to Sit - Progress: Progressing toward goal Pt will go Sit to Supine/Side: with modified independence PT Goal: Sit to Supine/Side - Progress: Progressing toward goal Pt will Transfer Sit to Stand/Stand to Sit: with supervision PT Transfer Goal: Sit to Stand/Stand to Sit - Progress: Progressing toward goal Pt will Transfer Bed to Chair/Chair to Bed: with supervision PT Transfer Goal: Bed to Chair/Chair to Bed - Progress: Progressing toward goal Pt will Ambulate: >150 feet;with least restrictive assistive device;with supervision PT Goal: Ambulate - Progress: Progressing toward goal Pt will Go Up / Down Stairs: 1-2 stairs;with rail(s);with supervision PT Goal: Up/Down Stairs - Progress: Progressing toward goal Pt will Perform Home Exercise Program: with supervision, verbal cues required/provided PT Goal: Perform Home Exercise Program - Progress: Progressing toward goal  PT Treatment Precautions/Restrictions  Precautions Precautions: Fall;Other (comment) (DNR) Required Braces or Orthoses: No Restrictions Weight Bearing Restrictions: No Other Position/Activity  Restrictions: pt keeps pulling at IV (obtained an activity vest for pt at end of session) Mobility (including Balance) Bed Mobility Bed Mobility: No (Pt and family refusing secondary to pt being d/ced soon)    Exercise    End of Session PT - End of Session Nurse Communication:  (Dicussed need for RW prior to D/C, contacted CM ) General Behavior During Session: [facility] for tasks performed Cognition: Impaired, at baseline  Colleen Collins  02/24/2011, 3:55 PM  Colleen Collins (Cheek) Colleen Collins PT, DPT Acute Rehabilitation [phone]

## 2011-03-08 ENCOUNTER — Other Ambulatory Visit: Payer: Medicare Other

## 2011-03-08 ENCOUNTER — Telehealth: Payer: Self-pay | Admitting: *Deleted

## 2011-03-08 NOTE — Telephone Encounter (Signed)
If patient is incontinent of urine may need in and out catheterization. If she develops any fever or acute worsening may need to consider ER followup.

## 2011-03-08 NOTE — Telephone Encounter (Signed)
Received PC from Robin with [facility].  She paid a visit today and pt C/O nausea, BS 252, however she has not taken her Metformin today, pt has dementia and was incontinent of stool and urine today, which is very unusual.  Questioning UTI?  Requesting OV tomorrow, AM booked, afternoon blocked. Please advise

## 2011-03-08 NOTE — Telephone Encounter (Signed)
I spoke with husband.  Pt is feeling some better and plans to try to eat some dinner.  No fever, no worsening sx.  Husband reports they do have a sterile spec. Bottle at the house.  He will try to collect a urine tomorrow morning and bring urine only to office to check for UTI. FYI

## 2011-03-09 ENCOUNTER — Other Ambulatory Visit (INDEPENDENT_AMBULATORY_CARE_PROVIDER_SITE_OTHER): Payer: Medicare Other | Admitting: Family Medicine

## 2011-03-09 ENCOUNTER — Emergency Department (HOSPITAL_BASED_OUTPATIENT_CLINIC_OR_DEPARTMENT_OTHER)
Admission: EM | Admit: 2011-03-09 | Discharge: 2011-03-09 | Disposition: A | Discharge status: Home / Self Care | Payer: Medicare Other | Attending: Emergency Medicine | Admitting: Emergency Medicine

## 2011-03-09 ENCOUNTER — Encounter (HOSPITAL_BASED_OUTPATIENT_CLINIC_OR_DEPARTMENT_OTHER): Payer: Self-pay

## 2011-03-09 DIAGNOSIS — N189 Chronic kidney disease, unspecified: Secondary | ICD-10-CM | POA: Insufficient documentation

## 2011-03-09 DIAGNOSIS — E785 Hyperlipidemia, unspecified: Secondary | ICD-10-CM | POA: Insufficient documentation

## 2011-03-09 DIAGNOSIS — E860 Dehydration: Secondary | ICD-10-CM | POA: Insufficient documentation

## 2011-03-09 DIAGNOSIS — R1110 Vomiting, unspecified: Secondary | ICD-10-CM | POA: Insufficient documentation

## 2011-03-09 DIAGNOSIS — N390 Urinary tract infection, site not specified: Secondary | ICD-10-CM

## 2011-03-09 DIAGNOSIS — K219 Gastro-esophageal reflux disease without esophagitis: Secondary | ICD-10-CM | POA: Insufficient documentation

## 2011-03-09 DIAGNOSIS — E119 Type 2 diabetes mellitus without complications: Secondary | ICD-10-CM | POA: Insufficient documentation

## 2011-03-09 LAB — COMPREHENSIVE METABOLIC PANEL
ALT: 13 U/L (ref 0–35)
AST: 21 U/L (ref 0–37)
Albumin: 4.1 g/dL (ref 3.5–5.2)
CO2: 24 mEq/L (ref 19–32)
Chloride: 99 mEq/L (ref 96–112)
Creatinine, Ser: 0.7 mg/dL (ref 0.50–1.10)
GFR calc non Af Amer: 76 mL/min — ABNORMAL LOW (ref >90)
Sodium: 136 mEq/L (ref 135–145)
Total Bilirubin: 0.7 mg/dL (ref 0.3–1.2)

## 2011-03-09 LAB — CBC
MCV: 96 fL (ref 78.0–100.0)
Platelets: 272 10*3/uL (ref 150–400)
RBC: 4.27 MIL/uL (ref 3.87–5.11)
RDW: 12.4 % (ref 11.5–15.5)
WBC: 6.1 10*3/uL (ref 4.0–10.5)

## 2011-03-09 LAB — POCT URINALYSIS DIPSTICK
Spec Grav, UA: >=1.03
Urobilinogen, UA: 0.2

## 2011-03-09 LAB — URINALYSIS, ROUTINE W REFLEX MICROSCOPIC
Bilirubin Urine: NEGATIVE
Glucose, UA: 500 mg/dL — AB
Hgb urine dipstick: NEGATIVE
Specific Gravity, Urine: 1.027 (ref 1.005–1.030)

## 2011-03-09 LAB — GLUCOSE, CAPILLARY: Glucose-Capillary: 232 mg/dL — ABNORMAL HIGH (ref 70–99)

## 2011-03-09 MED ORDER — SODIUM CHLORIDE 0.9 % IV BOLUS (SEPSIS)
1000.0000 mL | Freq: Once | INTRAVENOUS | Status: AC
  Administered 2011-03-09: 1000 mL via INTRAVENOUS

## 2011-03-09 NOTE — ED Notes (Signed)
cbg 232 

## 2011-03-09 NOTE — Progress Notes (Signed)
Quick Note:  Son informed on VM ______

## 2011-03-09 NOTE — Progress Notes (Signed)
Quick Note:  Son informed, Janit Pagan at [phone] ______

## 2011-03-09 NOTE — ED Notes (Signed)
Po taken well

## 2011-03-09 NOTE — Progress Notes (Signed)
Quick Note:  Pt husband and son informed ______

## 2011-03-09 NOTE — ED Provider Notes (Signed)
History     CSN: [id number] Arrival date & time: 03/09/2011  6:23 PM   First MD Initiated Contact with Patient 03/09/11 1831      Chief Complaint  Patient presents with  . Emesis    (Consider location/radiation/quality/duration/timing/severity/associated sxs/prior treatment) Patient is a 77 y.o. female presenting with vomiting. The history is provided by the patient and a relative. The history is limited by the condition of the patient (level 5 caveat, dementia).  Emesis   pt with nv 2 days ago. Emesis has resolved, but poor po intake. Had urine checked today by home health and was told dehydrated. No diarrhea, having normal bms. No abd pain. No fever or chills. No headache. No cp or sob. No cough or uri c/o. Emesis episodic, has resolved.   Past Medical History  Diagnosis Date  . Allergy   . GERD (gastroesophageal reflux disease)   . Chronic kidney disease     stones  . Ulcer   . Hyperlipidemia   . Hx of colonic polyps   . Arrhythmia   . Diabetes mellitus     Past Surgical History  Procedure Date  . Tonsillectomy   . Cataract extraction     Family History  Problem Relation Age of Onset  . Diabetes      fhx  . Kidney disease      fhx  . Sudden death      fhx    History  Substance Use Topics  . Smoking status: Never Smoker   . Smokeless tobacco: Current User    Types: Snuff  . Alcohol Use: No    OB History    Grav Para Term Preterm Abortions TAB SAB Ect Mult Living                  Review of Systems  Unable to perform ROS Gastrointestinal: Positive for vomiting.  level 5 caveat  Allergies  Review of patient's allergies indicates no known allergies.  Home Medications   Current Outpatient Rx  Name Route Sig Dispense Refill  . ALUMINUM-MAGNESIUM-SIMETHICONE 200-200-20 MG/5ML PO SUSP Oral Take 15 mLs by mouth once as needed. To settle stomach     . DONEPEZIL HCL 23 MG PO TABS Oral Take 1 tablet (23 mg total) by mouth at bedtime. 90 tablet 3  .  GLYBURIDE 5 MG PO TABS Oral Take 10 mg by mouth daily.     . SENNA 8.6 MG PO TABS Oral Take 2 tablets by mouth daily as needed. For constipation     . SIMVASTATIN 40 MG PO TABS Oral Take 40 mg by mouth daily.       BP 138/68  Pulse 81  Temp(Src) 97.6 F (36.4 C) (Oral)  Resp 20  SpO2 97%  Physical Exam  Nursing note and vitals reviewed. Constitutional: She appears well-developed and well-nourished. No distress.  HENT:  Head: Atraumatic.  Eyes: Conjunctivae are normal. Pupils are equal, round, and reactive to light. No scleral icterus.  Neck: Neck supple. No tracheal deviation present.  Cardiovascular: Normal rate, regular rhythm, normal heart sounds and intact distal pulses.   Pulmonary/Chest: Effort normal and breath sounds normal. No respiratory distress. She has no rales.  Abdominal: Soft. Normal appearance. She exhibits no distension and no mass. There is no tenderness. There is no rebound and no guarding.  Musculoskeletal: She exhibits no edema.  Neurological: She is alert.       Awake and alert. Answers questions yes/no. Ms c/w baseline per family.  Skin: Skin is warm and dry. No rash noted.  Psychiatric: She has a normal mood and affect.    ED Course  Procedures (including critical care time)   Labs Reviewed  CBC  COMPREHENSIVE METABOLIC PANEL  CBC  BASIC METABOLIC PANEL  URINALYSIS, ROUTINE W REFLEX MICROSCOPIC    Results for orders placed during the hospital encounter of 03/09/11  CBC      Component Value Range   WBC 6.1  4.0 - 10.5 (K/uL)   RBC 4.27  3.87 - 5.11 (MIL/uL)   Hemoglobin 14.1  12.0 - 15.0 (g/dL)   HCT 13.0  86.5 - 78.4 (%)   MCV 96.0  78.0 - 100.0 (fL)   MCH 33.0  26.0 - 34.0 (pg)   MCHC 34.4  30.0 - 36.0 (g/dL)   RDW 69.6  29.5 - 28.4 (%)   Platelets 272  150 - 400 (K/uL)  COMPREHENSIVE METABOLIC PANEL      Component Value Range   Sodium 136  135 - 145 (mEq/L)   Potassium 4.1  3.5 - 5.1 (mEq/L)   Chloride 99  96 - 112 (mEq/L)   CO2 24   19 - 32 (mEq/L)   Glucose, Bld 316 (*) 70 - 99 (mg/dL)   BUN 24 (*) 6 - 23 (mg/dL)   Creatinine, Ser 1.32  0.50 - 1.10 (mg/dL)   Calcium 44.0  8.4 - 10.5 (mg/dL)   Total Protein 7.9  6.0 - 8.3 (g/dL)   Albumin 4.1  3.5 - 5.2 (g/dL)   AST 21  0 - 37 (U/L)   ALT 13  0 - 35 (U/L)   Alkaline Phosphatase 74  39 - 117 (U/L)   Total Bilirubin 0.7  0.3 - 1.2 (mg/dL)   GFR calc non Af Amer 76 (*) >90 (mL/min)   GFR calc Af Amer 88 (*) >90 (mL/min)  URINALYSIS, ROUTINE W REFLEX MICROSCOPIC      Component Value Range   Color, Urine YELLOW  YELLOW    APPearance CLEAR  CLEAR    Specific Gravity, Urine 1.027  1.005 - 1.030    pH 5.0  5.0 - 8.0    Glucose, UA 500 (*) NEGATIVE (mg/dL)   Hgb urine dipstick NEGATIVE  NEGATIVE    Bilirubin Urine NEGATIVE  NEGATIVE    Ketones, ur 15 (*) NEGATIVE (mg/dL)   Protein, ur NEGATIVE  NEGATIVE (mg/dL)   Urobilinogen, UA 0.2  0.0 - 1.0 (mg/dL)   Nitrite NEGATIVE  NEGATIVE    Leukocytes, UA NEGATIVE  NEGATIVE   GLUCOSE, CAPILLARY      Component Value Range   Glucose-Capillary 232 (*) 70 - 99 (mg/dL)       MDM  Iv ns bolus. Labs.   Recheck bs improved. Tolerating po fluids. Smiling, alert, content. abd soft nt.       Suzi Roots, MD 03/09/11 2133

## 2011-03-09 NOTE — ED Notes (Signed)
Vomited x 1 two days ago-decreased po intake since then-had out pt urine taken to PCP office-was advised pt dehydrated

## 2011-03-13 ENCOUNTER — Telehealth: Payer: Self-pay | Admitting: *Deleted

## 2011-03-13 NOTE — Telephone Encounter (Signed)
Robin at [facility] informed, verbal order given

## 2011-03-13 NOTE — Telephone Encounter (Signed)
VM from Robin with [facility] requesting home visits once a week for a few more weeks Verbal order OK, please call [phone]

## 2011-03-13 NOTE — Telephone Encounter (Signed)
Verbal order OK to continue.

## 2011-03-16 ENCOUNTER — Emergency Department (INDEPENDENT_AMBULATORY_CARE_PROVIDER_SITE_OTHER): Payer: Medicare Other

## 2011-03-16 ENCOUNTER — Encounter (HOSPITAL_BASED_OUTPATIENT_CLINIC_OR_DEPARTMENT_OTHER): Payer: Self-pay | Admitting: *Deleted

## 2011-03-16 ENCOUNTER — Emergency Department (HOSPITAL_BASED_OUTPATIENT_CLINIC_OR_DEPARTMENT_OTHER)
Admission: EM | Admit: 2011-03-16 | Discharge: 2011-03-16 | Disposition: A | Discharge status: Home / Self Care | Payer: Medicare Other | Attending: Emergency Medicine | Admitting: Emergency Medicine

## 2011-03-16 DIAGNOSIS — N189 Chronic kidney disease, unspecified: Secondary | ICD-10-CM | POA: Insufficient documentation

## 2011-03-16 DIAGNOSIS — E1169 Type 2 diabetes mellitus with other specified complication: Secondary | ICD-10-CM | POA: Insufficient documentation

## 2011-03-16 DIAGNOSIS — I499 Cardiac arrhythmia, unspecified: Secondary | ICD-10-CM

## 2011-03-16 DIAGNOSIS — K219 Gastro-esophageal reflux disease without esophagitis: Secondary | ICD-10-CM | POA: Insufficient documentation

## 2011-03-16 DIAGNOSIS — Z79899 Other long term (current) drug therapy: Secondary | ICD-10-CM | POA: Insufficient documentation

## 2011-03-16 DIAGNOSIS — E119 Type 2 diabetes mellitus without complications: Secondary | ICD-10-CM

## 2011-03-16 DIAGNOSIS — R739 Hyperglycemia, unspecified: Secondary | ICD-10-CM

## 2011-03-16 DIAGNOSIS — E785 Hyperlipidemia, unspecified: Secondary | ICD-10-CM | POA: Insufficient documentation

## 2011-03-16 DIAGNOSIS — R7309 Other abnormal glucose: Secondary | ICD-10-CM

## 2011-03-16 DIAGNOSIS — F0390 Unspecified dementia without behavioral disturbance: Secondary | ICD-10-CM | POA: Insufficient documentation

## 2011-03-16 LAB — GLUCOSE, CAPILLARY: Glucose-Capillary: 351 mg/dL — ABNORMAL HIGH (ref 70–99)

## 2011-03-16 LAB — DIFFERENTIAL
Eosinophils Relative: 3 % (ref 0–5)
Lymphocytes Relative: 22 % (ref 12–46)
Lymphs Abs: 1.1 10*3/uL (ref 0.7–4.0)
Monocytes Absolute: 0.4 10*3/uL (ref 0.1–1.0)
Monocytes Relative: 8 % (ref 3–12)
Neutro Abs: 3.3 10*3/uL (ref 1.7–7.7)

## 2011-03-16 LAB — URINE MICROSCOPIC-ADD ON

## 2011-03-16 LAB — COMPREHENSIVE METABOLIC PANEL
AST: 25 U/L (ref 0–37)
BUN: 16 mg/dL (ref 6–23)
CO2: 29 mEq/L (ref 19–32)
Chloride: 99 mEq/L (ref 96–112)
Creatinine, Ser: 0.8 mg/dL (ref 0.50–1.10)
GFR calc Af Amer: 75 mL/min — ABNORMAL LOW (ref >90)
GFR calc non Af Amer: 65 mL/min — ABNORMAL LOW (ref >90)
Glucose, Bld: 457 mg/dL — ABNORMAL HIGH (ref 70–99)
Total Bilirubin: 0.4 mg/dL (ref 0.3–1.2)

## 2011-03-16 LAB — URINALYSIS, ROUTINE W REFLEX MICROSCOPIC
Glucose, UA: >1000 mg/dL — AB
Ketones, ur: NEGATIVE mg/dL
Leukocytes, UA: NEGATIVE
Protein, ur: NEGATIVE mg/dL
Urobilinogen, UA: 0.2 mg/dL (ref 0.0–1.0)

## 2011-03-16 LAB — LIPASE, BLOOD: Lipase: 77 U/L — ABNORMAL HIGH (ref 11–59)

## 2011-03-16 LAB — CBC
HCT: 40.4 % (ref 36.0–46.0)
Hemoglobin: 13.9 g/dL (ref 12.0–15.0)
MCV: 97.1 fL (ref 78.0–100.0)
WBC: 4.9 10*3/uL (ref 4.0–10.5)

## 2011-03-16 MED ORDER — INSULIN ASPART 100 UNIT/ML ~~LOC~~ SOLN
5.0000 [IU] | Freq: Once | SUBCUTANEOUS | Status: DC
  Filled 2011-03-16: qty 3

## 2011-03-16 MED ORDER — SODIUM CHLORIDE 0.9 % IV BOLUS (SEPSIS)
1000.0000 mL | Freq: Once | INTRAVENOUS | Status: AC
  Administered 2011-03-16: 1000 mL via INTRAVENOUS

## 2011-03-16 MED ORDER — INSULIN REGULAR HUMAN 100 UNIT/ML IJ SOLN
5.0000 [IU] | Freq: Once | INTRAMUSCULAR | Status: AC
  Administered 2011-03-16: 5 [IU] via SUBCUTANEOUS
  Filled 2011-03-16: qty 3

## 2011-03-16 MED ORDER — DEXTROSE 5 % IV SOLN
1.0000 g | Freq: Once | INTRAVENOUS | Status: DC
  Administered 2011-03-16: 1 g via INTRAVENOUS
  Filled 2011-03-16: qty 10

## 2011-03-16 NOTE — ED Provider Notes (Signed)
History     CSN: [id number] Arrival date & time: 03/16/2011  1:45 PM   First MD Initiated Contact with Patient 03/16/11 1347      Chief Complaint  Patient presents with  . Hyperglycemia    (Consider location/radiation/quality/duration/timing/severity/associated sxs/prior treatment) HPI Patient is an 77 yo female who presents today for hyperglycemia. She has history of dementia and normally is cared for at home by family. She is normally not oriented in any fashion. She is at her neurologic baseline. Patient had glucose of 440 at home. She is normally on glyburide and does not use insulin. She's had no signs of infection. Patient had no cough and no increased urinary frequency. She has not had any fevers. There are no other associated or modifying factors Past Medical History  Diagnosis Date  . Allergy   . GERD (gastroesophageal reflux disease)   . Chronic kidney disease     stones  . Ulcer   . Hyperlipidemia   . Hx of colonic polyps   . Arrhythmia   . Diabetes mellitus     Past Surgical History  Procedure Date  . Tonsillectomy   . Cataract extraction     Family History  Problem Relation Age of Onset  . Diabetes      fhx  . Kidney disease      fhx  . Sudden death      fhx    History  Substance Use Topics  . Smoking status: Never Smoker   . Smokeless tobacco: Current User    Types: Snuff  . Alcohol Use: No    OB History    Grav Para Term Preterm Abortions TAB SAB Ect Mult Living                  Review of Systems  Constitutional: Negative.   HENT: Negative.   Eyes: Negative.   Respiratory: Negative.   Cardiovascular: Negative.   Gastrointestinal: Negative.   Genitourinary: Negative.   Musculoskeletal: Negative.   Skin: Negative.   Neurological: Negative.   Hematological: Negative.   Psychiatric/Behavioral: Negative.   All other systems reviewed and are negative.    Allergies  Review of patient's allergies indicates no known allergies.  Home  Medications   Current Outpatient Rx  Name Route Sig Dispense Refill  . ALUMINUM-MAGNESIUM-SIMETHICONE 200-200-20 MG/5ML PO SUSP Oral Take 15 mLs by mouth once as needed. To settle stomach     . DONEPEZIL HCL 23 MG PO TABS Oral Take 1 tablet (23 mg total) by mouth at bedtime. 90 tablet 3  . GLYBURIDE 5 MG PO TABS Oral Take 10 mg by mouth daily.     . SENNA 8.6 MG PO TABS Oral Take 2 tablets by mouth daily as needed. For constipation     . SIMVASTATIN 40 MG PO TABS Oral Take 40 mg by mouth daily.       BP 113/63  Pulse 73  Resp 20  SpO2 100%  Physical Exam  Nursing note and vitals reviewed. Constitutional: She appears well-developed and well-nourished. No distress.  HENT:  Head: Normocephalic and atraumatic.  Eyes: Conjunctivae and EOM are normal. Pupils are equal, round, and reactive to light.  Neck: Normal range of motion.  Cardiovascular: Normal rate, regular rhythm, normal heart sounds and intact distal pulses.  Exam reveals no gallop and no friction rub.   No murmur heard. Pulmonary/Chest: Effort normal and breath sounds normal. No respiratory distress. She has no wheezes. She has no rales.  Abdominal: Soft.  Bowel sounds are normal. She exhibits no distension. There is no tenderness. There is no rebound and no guarding.  Musculoskeletal: Normal range of motion. She exhibits no edema.  Neurological: She is alert. No cranial nerve deficit. She exhibits normal muscle tone. Coordination normal.       Patient is not oriented at baseline. Family reports she is at her neurologic baseline. She has no other deficits there  Skin: Skin is warm and dry. No rash noted.  Psychiatric: She has a normal mood and affect.    ED Course  Procedures (including critical care time)  Labs Reviewed  COMPREHENSIVE METABOLIC PANEL - Abnormal; Notable for the following:    Glucose, Bld 457 (*)    GFR calc non Af Amer 65 (*)    GFR calc Af Amer 75 (*)    All other components within normal limits    LIPASE, BLOOD - Abnormal; Notable for the following:    Lipase 77 (*)    All other components within normal limits  URINALYSIS, ROUTINE W REFLEX MICROSCOPIC - Abnormal; Notable for the following:    APPearance CLOUDY (*)    Specific Gravity, Urine 1.034 (*)    Glucose, UA >1000 (*)    All other components within normal limits  CBC  DIFFERENTIAL  URINE MICROSCOPIC-ADD ON  URINE CULTURE  POCT CBG MONITORING   Dg Chest 2 View  03/16/2011  *RADIOLOGY REPORT*  Clinical Data: Hyperglycemia, diabetic, arrhythmia  CHEST - 2 VIEW  Comparison: February 19, 2011  Findings: The cardiac silhouette, mediastinum, pulmonary vasculature are within normal limits.  Both lungs are clear. There is no acute bony abnormality.  IMPRESSION: There is no evidence of acute cardiac or pulmonary process.  Original Report Authenticated By: Brandon Melnick, M.D.     1. Hyperglycemia       MDM  Patient was evaluated and was hemodynamically stable. Her only symptom was hyperglycemia. Her normal blood glucose runs around 250 per family. She's had no signs of infection. Chest x-ray and urinalysis were unremarkable. Patient did not have a leukocytosis. Her renal function was within normal limits. She did not have an anion gap. Patient received 1 L normal saline IV bolus. Glucose was rechecked and was 351. Patient was given 5 units of subcutaneous regular insulin. Patient did not have an anion gap. She remained hemodynamic stable. She'll be rechecked. If glucose is less than 250 patient should be able to be discharged in good condition. Dural be signed out to oncoming physician. Please severe than this dictation if any changes occur in patient's care.        Cyndra Numbers, MD 03/16/11 1620

## 2011-03-16 NOTE — ED Provider Notes (Signed)
Patient signed out to me by Dr. Alto Denver at the end of her shift. She is here for hyperglycemia. Her labs and urine workup has been sent and unremarkable there is a urine culture pending. Her last glucose in the ED was 351. She is receiving 5 units of insulin and will he ready for discharge when leukosis is lowered to approximately 250.  Colleen Chick, MD 03/16/11 [phone]

## 2011-03-16 NOTE — ED Notes (Signed)
Unable to get oral temp due to pt eating ice in the waiting room.

## 2011-03-16 NOTE — ED Notes (Signed)
CBG 351.  RN Amy Burns Notified.

## 2011-03-16 NOTE — ED Notes (Signed)
Blood sugar was 440 about 45 mins ago. Family states she has been complaining of not feeling good.

## 2011-03-18 LAB — URINE CULTURE: Colony Count: 65000

## 2011-03-21 ENCOUNTER — Ambulatory Visit: Payer: Medicare Other | Admitting: *Deleted

## 2011-03-22 ENCOUNTER — Telehealth: Payer: Self-pay | Admitting: *Deleted

## 2011-03-22 NOTE — Telephone Encounter (Signed)
OK to order 

## 2011-03-22 NOTE — Telephone Encounter (Signed)
Verbal order given in Susan Moore VM

## 2011-03-22 NOTE — Telephone Encounter (Signed)
[facility], Robin left VM.  Requesting Home Health [facility] order to help pt with ADL's for a few weeks Verbal order OK to [phone]

## 2011-03-28 ENCOUNTER — Emergency Department (HOSPITAL_BASED_OUTPATIENT_CLINIC_OR_DEPARTMENT_OTHER)
Admission: EM | Admit: 2011-03-28 | Discharge: 2011-03-28 | Disposition: A | Discharge status: Home / Self Care | Payer: Medicare Other | Attending: Emergency Medicine | Admitting: Emergency Medicine

## 2011-03-28 ENCOUNTER — Telehealth: Payer: Self-pay | Admitting: *Deleted

## 2011-03-28 ENCOUNTER — Encounter (HOSPITAL_BASED_OUTPATIENT_CLINIC_OR_DEPARTMENT_OTHER): Payer: Self-pay | Admitting: *Deleted

## 2011-03-28 DIAGNOSIS — R197 Diarrhea, unspecified: Secondary | ICD-10-CM | POA: Insufficient documentation

## 2011-03-28 DIAGNOSIS — E119 Type 2 diabetes mellitus without complications: Secondary | ICD-10-CM | POA: Insufficient documentation

## 2011-03-28 DIAGNOSIS — K219 Gastro-esophageal reflux disease without esophagitis: Secondary | ICD-10-CM | POA: Insufficient documentation

## 2011-03-28 DIAGNOSIS — E860 Dehydration: Secondary | ICD-10-CM | POA: Insufficient documentation

## 2011-03-28 DIAGNOSIS — E785 Hyperlipidemia, unspecified: Secondary | ICD-10-CM | POA: Insufficient documentation

## 2011-03-28 DIAGNOSIS — F0390 Unspecified dementia without behavioral disturbance: Secondary | ICD-10-CM | POA: Insufficient documentation

## 2011-03-28 HISTORY — DX: Unspecified dementia, unspecified severity, without behavioral disturbance, psychotic disturbance, mood disturbance, and anxiety: F03.90

## 2011-03-28 LAB — CBC
HCT: 38.5 % (ref 36.0–46.0)
MCH: 33.2 pg (ref 26.0–34.0)
MCV: 97.7 fL (ref 78.0–100.0)
Platelets: 248 10*3/uL (ref 150–400)
RBC: 3.94 MIL/uL (ref 3.87–5.11)
RDW: 12.6 % (ref 11.5–15.5)

## 2011-03-28 LAB — BASIC METABOLIC PANEL
BUN: 18 mg/dL (ref 6–23)
CO2: 28 mEq/L (ref 19–32)
Calcium: 9.9 mg/dL (ref 8.4–10.5)
Creatinine, Ser: 0.9 mg/dL (ref 0.50–1.10)

## 2011-03-28 MED ORDER — SODIUM CHLORIDE 0.9 % IV BOLUS (SEPSIS)
1000.0000 mL | Freq: Once | INTRAVENOUS | Status: AC
  Administered 2011-03-28: 1000 mL via INTRAVENOUS

## 2011-03-28 MED ORDER — SODIUM CHLORIDE 0.9 % IV SOLN: Freq: Once | INTRAVENOUS | Status: DC

## 2011-03-28 NOTE — ED Notes (Signed)
I checked on urine sample, patient was on bed pan where she passed stool, but no urine. Dr. Patria Mane asked me to collect hemocult sample. I turned into lab.

## 2011-03-28 NOTE — ED Notes (Signed)
When Bolus has infused the Pt. Is up for discharge

## 2011-03-28 NOTE — ED Provider Notes (Signed)
History     CSN: [id number] Arrival date & time: 03/28/2011 11:19 AM   First MD Initiated Contact with Patient 03/28/11 1119      Chief Complaint  Patient presents with  . Dehydration    (Consider location/radiation/quality/duration/timing/severity/associated sxs/prior treatment) The history is provided by the patient and the spouse. History Limited By: Dementia.   asthma reports multiple episodes of diarrhea over the past 24 hours.  He denies blood in the diarrhea.  She's had no nausea or vomiting.  He reports her mentation is at baseline.  He denies fevers.  He reports his had to clean her several times and he was concerned about the possibility for dehydration and thus brought her to the emergency department for evaluation.  She's been eating and drinking fine over the past 24 hours and continues to make urine.  She has a history of dementia and therefore she is unable to provide history  Past Medical History  Diagnosis Date  . Allergy   . GERD (gastroesophageal reflux disease)   . Chronic kidney disease     stones  . Ulcer   . Hyperlipidemia   . Hx of colonic polyps   . Arrhythmia   . Diabetes mellitus   . Dementia     Past Surgical History  Procedure Date  . Tonsillectomy   . Cataract extraction     Family History  Problem Relation Age of Onset  . Diabetes      fhx  . Kidney disease      fhx  . Sudden death      fhx    History  Substance Use Topics  . Smoking status: Never Smoker   . Smokeless tobacco: Current User    Types: Snuff  . Alcohol Use: No    OB History    Grav Para Term Preterm Abortions TAB SAB Ect Mult Living                  Review of Systems  Unable to perform ROS   Allergies  Review of patient's allergies indicates no known allergies.  Home Medications   Current Outpatient Rx  Name Route Sig Dispense Refill  . ALUMINUM-MAGNESIUM-SIMETHICONE 200-200-20 MG/5ML PO SUSP Oral Take 15 mLs by mouth once as needed. To settle  stomach     . DONEPEZIL HCL 23 MG PO TABS Oral Take 1 tablet (23 mg total) by mouth at bedtime. 90 tablet 3  . GLYBURIDE 5 MG PO TABS Oral Take 10 mg by mouth daily.     . SENNA 8.6 MG PO TABS Oral Take 2 tablets by mouth daily as needed. For constipation     . SIMVASTATIN 40 MG PO TABS Oral Take 40 mg by mouth daily.       BP 143/62  Pulse 67  Temp(Src) 98.1 F (36.7 C) (Oral)  Resp 16  SpO2 94%  Physical Exam  Nursing note and vitals reviewed. Constitutional: She appears well-developed and well-nourished. No distress.  HENT:  Head: Normocephalic and atraumatic.  Eyes: EOM are normal.  Neck: Normal range of motion.  Cardiovascular: Normal rate, regular rhythm and normal heart sounds.   Pulmonary/Chest: Effort normal and breath sounds normal.  Abdominal: Soft. She exhibits no distension. There is no tenderness.  Musculoskeletal: Normal range of motion.  Neurological: She is alert.       No gross motor abnormality  Skin: Skin is warm and dry.  Psychiatric: She has a normal mood and affect. Judgment normal.  ED Course  Procedures (including critical care time)  Labs Reviewed  BASIC METABOLIC PANEL - Abnormal; Notable for the following:    Glucose, Bld 193 (*)    GFR calc non Af Amer 56 (*)    GFR calc Af Amer 65 (*)    All other components within normal limits  OCCULT BLOOD X 1 CARD TO LAB, STOOL  CBC   No results found.   1. Diarrhea       MDM  Isolated diarrhea without bleeding.  Normal vital signs.  Hydrated in the emergency department.  Discharge home with close followup with her primary care Dr.  Leonides Grills baseline mentation        Lyanne Co, MD 03/28/11 1249

## 2011-03-28 NOTE — Telephone Encounter (Signed)
Husband called Colleen Collins from [facility] worried about pt having so much diarrhea that she is getting dehydrated. He is going to take her the ED.

## 2011-03-28 NOTE — ED Notes (Signed)
Family at bedside. 

## 2011-03-28 NOTE — ED Notes (Signed)
Pt. Husband reports the pt. Has multiple episodes of Diarrhea and no vomiting.

## 2011-04-19 ENCOUNTER — Encounter: Payer: Self-pay | Admitting: *Deleted

## 2011-04-19 ENCOUNTER — Encounter: Payer: Medicare Other | Attending: Family Medicine | Admitting: *Deleted

## 2011-04-19 DIAGNOSIS — R7309 Other abnormal glucose: Secondary | ICD-10-CM | POA: Insufficient documentation

## 2011-04-19 DIAGNOSIS — Z713 Dietary counseling and surveillance: Secondary | ICD-10-CM | POA: Insufficient documentation

## 2011-04-19 DIAGNOSIS — E119 Type 2 diabetes mellitus without complications: Secondary | ICD-10-CM

## 2011-04-19 DIAGNOSIS — R627 Adult failure to thrive: Secondary | ICD-10-CM | POA: Insufficient documentation

## 2011-04-19 DIAGNOSIS — R739 Hyperglycemia, unspecified: Secondary | ICD-10-CM

## 2011-04-19 NOTE — Patient Instructions (Signed)
Plan: Continue with 3 meals a day and snacks as needed for hunger Continue with Glucerna BID at breakfast and supper Be aware of symptoms of low BG while on Glyburide in case of decreased food intake and treat with 15 grams carbohydrate Continue checking BG as directed by MD

## 2011-04-19 NOTE — Progress Notes (Signed)
  Medical Nutrition Therapy:  Appt start time: 1100 end time:  1200.   Assessment:  Primary concerns today: Patient has dementia and unable to come for this appointment in person. Her husband is here along with a friend/cardgiver who drives this couple and assists with their care. Husband and caregiver state patient is in advanced stages of dementia and cannot be left alone. She has erratic behavior and eating habits, though with direction and coaching she can be managed fairly well. After a recent hospitalization, they started offering Glucerna with breakfast and supper and she is much improved in terms of appetite and BG results which they state average around 150 mg/dl now.    MEDICATIONS: see list. Diabetes med is Glyburide 5 mg / day   DIETARY INTAKE:  Usual eating pattern includes 3 meals and 1-3 snacks per day.  Everyday foods include good variety of most food groups now.  Avoided foods include high fat foods.    24-hr recall:  B ( AM): deviled egg sandwich OR fiber one cereal with fruit & 2% milk, Glucerna at every breakfast  Snk ( AM): occasional 1/2 sandwich  L ( PM): chicken salad sand, 1/2 to whole, fruit, diet Gingerale Snk ( PM): none D ( PM): Bring food home from restaurant, hot meal with fish, vegetables, 1/2 biscuit, diet Gingerale & Glucerna Snk ( PM): occasional SF chocolate pudding Beverages: Glucerna, diet Gingerale  Usual physical activity: limited due to age and dementia  Estimated energy needs: 1400 calories 158 g carbohydrates 105 g protein 39 g fat  Progress Towards Goal(s):  In progress.   Nutritional Diagnosis:  NI-5.8.4 Inconsistent carbohydrate intake As related to diabetes.  As evidenced by lack of knowledge by family on carb counting.    Intervention:  Nutrition counseling and diabetes education provided for husband and care giver. Explained basic physiology of diabetes, relationship of food and carbohydrate, and insulin to BG. Taught basic carb  counting and verified that their current meal plans are working well for this patient. Discussed cause of possible low BG if Glyburide is taken and food intake declines as well as treatment for low BG if it occurs. Plan: Continue with 3 meals a day and snacks as needed for hunger Continue with Glucerna BID at breakfast and supper Be aware of symptoms of low BG while on Glyburide in case of decreased food intake and treat with 15 grams carbohydrate Continue checking BG as directed by MD  Handouts given during visit include:  Carb Counting and Label Reading   Living Well with Diabetes  Monitoring/Evaluation:  Dietary intake, exercise, label reading, and body weight prn.

## 2011-05-05 ENCOUNTER — Ambulatory Visit: Payer: Medicare Other | Admitting: Family Medicine

## 2011-05-08 ENCOUNTER — Encounter: Payer: Self-pay | Admitting: Family Medicine

## 2011-05-08 ENCOUNTER — Ambulatory Visit (INDEPENDENT_AMBULATORY_CARE_PROVIDER_SITE_OTHER): Payer: Medicare Other | Admitting: Family Medicine

## 2011-05-08 VITALS — BP 110/62 | HR 72 | Temp 97.5°F | Resp 12 | Ht 62.0 in | Wt 110.0 lb

## 2011-05-08 DIAGNOSIS — E559 Vitamin D deficiency, unspecified: Secondary | ICD-10-CM

## 2011-05-08 DIAGNOSIS — Z0000 Encounter for general adult medical examination without abnormal findings: Secondary | ICD-10-CM

## 2011-05-08 DIAGNOSIS — D649 Anemia, unspecified: Secondary | ICD-10-CM

## 2011-05-08 DIAGNOSIS — E119 Type 2 diabetes mellitus without complications: Secondary | ICD-10-CM

## 2011-05-08 DIAGNOSIS — F0280 Dementia in other diseases classified elsewhere without behavioral disturbance: Secondary | ICD-10-CM

## 2011-05-08 DIAGNOSIS — E785 Hyperlipidemia, unspecified: Secondary | ICD-10-CM

## 2011-05-08 DIAGNOSIS — R627 Adult failure to thrive: Secondary | ICD-10-CM

## 2011-05-08 DIAGNOSIS — Z299 Encounter for prophylactic measures, unspecified: Secondary | ICD-10-CM

## 2011-05-08 DIAGNOSIS — G309 Alzheimer's disease, unspecified: Secondary | ICD-10-CM

## 2011-05-08 LAB — BASIC METABOLIC PANEL
BUN: 23 mg/dL (ref 6–23)
CO2: 31 mEq/L (ref 19–32)
Chloride: 107 mEq/L (ref 96–112)
GFR: 61.47 mL/min (ref >60.00)
Glucose, Bld: 165 mg/dL — ABNORMAL HIGH (ref 70–99)
Potassium: 4.2 mEq/L (ref 3.5–5.1)
Sodium: 148 mEq/L — ABNORMAL HIGH (ref 135–145)

## 2011-05-08 LAB — HEMOGLOBIN A1C: Hgb A1c MFr Bld: 8.6 % — ABNORMAL HIGH (ref 4.6–6.5)

## 2011-05-08 LAB — HEPATIC FUNCTION PANEL
ALT: 18 U/L (ref 0–35)
Bilirubin, Direct: 0.1 mg/dL (ref 0.0–0.3)
Total Bilirubin: 0.7 mg/dL (ref 0.3–1.2)

## 2011-05-08 LAB — LIPID PANEL
Total CHOL/HDL Ratio: 2
VLDL: 14.2 mg/dL (ref 0.0–40.0)

## 2011-05-08 MED ORDER — TETANUS-DIPHTH-ACELL PERTUSSIS 5-2.5-18.5 LF-MCG/0.5 IM SUSP: 0.5000 mL | Freq: Once | INTRAMUSCULAR | Status: DC

## 2011-05-08 MED ORDER — DONEPEZIL HCL 23 MG PO TABS: 23.0000 mg | ORAL_TABLET | Freq: Every day | ORAL | Status: DC

## 2011-05-08 MED ORDER — SIMVASTATIN 40 MG PO TABS: 40.0000 mg | ORAL_TABLET | Freq: Every day | ORAL | Status: DC

## 2011-05-08 MED ORDER — PNEUMOCOCCAL VAC POLYVALENT 25 MCG/0.5ML IJ INJ: 0.5000 mL | INJECTION | Freq: Once | INTRAMUSCULAR | Status: DC

## 2011-05-08 MED ORDER — GLYBURIDE 5 MG PO TABS: 5.0000 mg | ORAL_TABLET | Freq: Two times a day (BID) | ORAL | Status: DC

## 2011-05-08 NOTE — Progress Notes (Signed)
Subjective:    Patient ID: Colleen Collins, female    DOB: 1922-10-15, 78 y.o.   MRN: [id number]  HPI  Patient here for medical followup and Medicare wellness exam.  Her medical problems include history of type 2 diabetes, hyperlipidemia, Alzheimer's disease, vitamin D deficiency. She had some failure to thrive and recently set up for her to see nutritionist. Family feels this has been very useful. Her blood sugars have been much improved with Glucerna with fasting blood sugars around 150. Recent A1c was over 10%. Family very reluctant to consider insulin. Her current medications are Aricept, glyburide, and simvastatin. No recent hypoglycemia.  Cannot confirm prior Pneumovax or tetanus. Did receive flu vaccine.  1.  Risk factors based on Past Medical , Social, and Family history  reviewed and as indicated below 2.  Limitations in physical activities somewhat sedentary. No recent falls. 3.  Depression/mood no recent depression issues. She has fairly advanced Alzheimer's dementia. No agitation 4.  Hearing no major deficits 5.  ADLs dependent on husband for basic assistance with dressing and meal preparation. 6.  Cognitive function (orientation to time and place, language, writing, speech,memory) advanced dementia as above. Impairment in short-term memory. No language deficits. Judgment impaired 7.  Home Safety discussed multiple issues such as firearms and fall risk reduction. They do not have any throat drugs or other high risk for fall 8.  Height, weight, and visual acuity. Some mild recent weight loss but family states she is eating very well. No recent visual changes 9.  Counseling diet discussed. Measures to reduce falls 10. Recommendation of preventive services. Pneumovax and Tdap recommended 11. Labs based on risk factors hemoglobin A1c, vitamin D level, basic metabolic panel, lipid panel, and hepatic panel 12. Care Plan as above  Past Medical History  Diagnosis Date  . Allergy   . GERD  (gastroesophageal reflux disease)   . Chronic kidney disease     stones  . Ulcer   . Hyperlipidemia   . Hx of colonic polyps   . Arrhythmia   . Diabetes mellitus   . Dementia    Past Surgical History  Procedure Date  . Tonsillectomy   . Cataract extraction     reports that she has never smoked. Her smokeless tobacco use includes Snuff. She reports that she does not drink alcohol or use illicit drugs. family history includes Diabetes in an unspecified family member; Kidney disease in an unspecified family member; and Sudden death in an unspecified family member. No Known Allergies     Review of Systems  Unable to perform ROS Constitutional:       Pt has dementia so unable to give much history.  Per her husband go appetite.  Some urine frequency.  No fever or c/o burning.  No recent falls.  She does not complain of any pain.       Objective:   Physical Exam  Constitutional:       Alert pleasant demented elderly female  HENT:  Mouth/Throat: Oropharynx is clear and moist.       Minimal cerumen both canals  Eyes: Pupils are equal, round, and reactive to light.  Neck: Neck supple.  Cardiovascular: Normal rate and regular rhythm.   Pulmonary/Chest: Effort normal and breath sounds normal.  Abdominal: Soft. Bowel sounds are normal. She exhibits no distension and no mass. There is no tenderness. There is no rebound and no guarding.  Musculoskeletal: She exhibits no edema.  Lymphadenopathy:    She has no cervical adenopathy.  Neurological: She is alert. No cranial nerve deficit.  Skin: No rash noted.  Psychiatric: She has a normal mood and affect.          Assessment & Plan:  #1 health maintenance. Tetanus and Pneumovax given. Check vitamin D level.  Discussed fall risk reduction #2 hyperlipidemia. Check lipid and hepatic panel. Refill medication for one year #3 type 2 diabetes. History of poor control. Per family improved control with Glucerna. Recheck A1c. Will not aim  for extremely tight control given her dementia and age #4 history vitamin D deficiency. Recheck vitamin D level and replace if low to reduce risk of falls

## 2011-05-09 NOTE — Progress Notes (Signed)
Quick Note:  Caregiver Chris informed ______

## 2011-05-11 NOTE — Progress Notes (Signed)
Quick Note:  Pt husband informed. He reports having a terrible time getting her to sleep. Requesting refill of temazepam for use ay bedtime ______

## 2011-05-11 NOTE — Progress Notes (Signed)
Quick Note:  I called pt husband back, he made a mistake and is requesting Clonazepam instead, he got an RX for himself, but not for her, #90 with refills for sleep ______

## 2011-05-12 ENCOUNTER — Other Ambulatory Visit: Payer: Self-pay | Admitting: Family Medicine

## 2011-05-12 ENCOUNTER — Telehealth: Payer: Self-pay | Admitting: *Deleted

## 2011-05-12 MED ORDER — VITAMIN D (ERGOCALCIFEROL) 1.25 MG (50000 UNIT) PO CAPS: 50000.0000 [IU] | ORAL_CAPSULE | ORAL | Status: DC

## 2011-05-12 NOTE — Telephone Encounter (Signed)
Husband requesting clonazepam to help her sleep.  He has been giving her his pills and they work great.  She has trouble sleeping, wrestless.

## 2011-05-12 NOTE — Telephone Encounter (Signed)
There are potential increased risk of falls with any sleep medication.  We should try to go with low dose.  Clonazepam 0.5 mg one-half to one tablet po qhs prn insomnia, #30 with one refill.

## 2011-05-12 NOTE — Telephone Encounter (Signed)
Pt godson (chris) is ?vitamin d that was suppose to be call into [address].

## 2011-05-12 NOTE — Telephone Encounter (Signed)
Chris informed rx sent, future lab ordered

## 2011-05-15 NOTE — Telephone Encounter (Signed)
Pt informed on home VM 

## 2011-07-01 ENCOUNTER — Inpatient Hospital Stay (HOSPITAL_BASED_OUTPATIENT_CLINIC_OR_DEPARTMENT_OTHER)
Admission: EM | Admit: 2011-07-01 | Discharge: 2011-07-03 | DRG: 683 | Disposition: A | Discharge status: Home Health | Payer: Medicare Other | Attending: Family Medicine | Admitting: Family Medicine

## 2011-07-01 ENCOUNTER — Emergency Department (INDEPENDENT_AMBULATORY_CARE_PROVIDER_SITE_OTHER): Payer: Medicare Other

## 2011-07-01 ENCOUNTER — Encounter (HOSPITAL_BASED_OUTPATIENT_CLINIC_OR_DEPARTMENT_OTHER): Payer: Self-pay | Admitting: *Deleted

## 2011-07-01 DIAGNOSIS — E559 Vitamin D deficiency, unspecified: Secondary | ICD-10-CM

## 2011-07-01 DIAGNOSIS — N390 Urinary tract infection, site not specified: Secondary | ICD-10-CM

## 2011-07-01 DIAGNOSIS — F411 Generalized anxiety disorder: Secondary | ICD-10-CM

## 2011-07-01 DIAGNOSIS — Z860100 Personal history of colon polyps, unspecified: Secondary | ICD-10-CM

## 2011-07-01 DIAGNOSIS — K219 Gastro-esophageal reflux disease without esophagitis: Secondary | ICD-10-CM

## 2011-07-01 DIAGNOSIS — F0280 Dementia in other diseases classified elsewhere without behavioral disturbance: Secondary | ICD-10-CM

## 2011-07-01 DIAGNOSIS — Z833 Family history of diabetes mellitus: Secondary | ICD-10-CM

## 2011-07-01 DIAGNOSIS — J209 Acute bronchitis, unspecified: Secondary | ICD-10-CM

## 2011-07-01 DIAGNOSIS — E785 Hyperlipidemia, unspecified: Secondary | ICD-10-CM

## 2011-07-01 DIAGNOSIS — J309 Allergic rhinitis, unspecified: Secondary | ICD-10-CM

## 2011-07-01 DIAGNOSIS — R4182 Altered mental status, unspecified: Secondary | ICD-10-CM

## 2011-07-01 DIAGNOSIS — E119 Type 2 diabetes mellitus without complications: Secondary | ICD-10-CM

## 2011-07-01 DIAGNOSIS — I10 Essential (primary) hypertension: Secondary | ICD-10-CM

## 2011-07-01 DIAGNOSIS — G309 Alzheimer's disease, unspecified: Secondary | ICD-10-CM | POA: Diagnosis present

## 2011-07-01 DIAGNOSIS — N179 Acute kidney failure, unspecified: Principal | ICD-10-CM

## 2011-07-01 DIAGNOSIS — E1169 Type 2 diabetes mellitus with other specified complication: Secondary | ICD-10-CM | POA: Diagnosis present

## 2011-07-01 DIAGNOSIS — R627 Adult failure to thrive: Secondary | ICD-10-CM

## 2011-07-01 DIAGNOSIS — R739 Hyperglycemia, unspecified: Secondary | ICD-10-CM | POA: Diagnosis present

## 2011-07-01 LAB — DIFFERENTIAL
Basophils Absolute: 0 10*3/uL (ref 0.0–0.1)
Basophils Relative: 1 % (ref 0–1)
Eosinophils Absolute: 0.1 10*3/uL (ref 0.0–0.7)
Eosinophils Relative: 2 % (ref 0–5)
Lymphocytes Relative: 17 % (ref 12–46)
Lymphs Abs: 1.1 10*3/uL (ref 0.7–4.0)
Monocytes Absolute: 0.5 10*3/uL (ref 0.1–1.0)
Monocytes Relative: 9 % (ref 3–12)
Neutro Abs: 4.4 10*3/uL (ref 1.7–7.7)
Neutrophils Relative %: 72 % (ref 43–77)

## 2011-07-01 LAB — CBC
HCT: 37.7 % (ref 36.0–46.0)
Hemoglobin: 12.8 g/dL (ref 12.0–15.0)
MCH: 33.5 pg (ref 26.0–34.0)
MCV: 98.7 fL (ref 78.0–100.0)
RBC: 3.82 MIL/uL — ABNORMAL LOW (ref 3.87–5.11)
WBC: 6.2 10*3/uL (ref 4.0–10.5)

## 2011-07-01 LAB — BASIC METABOLIC PANEL
BUN: 26 mg/dL — ABNORMAL HIGH (ref 6–23)
CO2: 29 mEq/L (ref 19–32)
Calcium: 9.6 mg/dL (ref 8.4–10.5)
GFR calc non Af Amer: 65 mL/min — ABNORMAL LOW (ref >90)
Glucose, Bld: 415 mg/dL — ABNORMAL HIGH (ref 70–99)
Sodium: 136 mEq/L (ref 135–145)

## 2011-07-01 LAB — GLUCOSE, CAPILLARY: Glucose-Capillary: 468 mg/dL — ABNORMAL HIGH (ref 70–99)

## 2011-07-01 MED ORDER — DEXTROSE 5 % IV SOLN
500.0000 mg | Freq: Once | INTRAVENOUS | Status: AC
  Administered 2011-07-01: 500 mg via INTRAVENOUS
  Filled 2011-07-01: qty 500

## 2011-07-01 MED ORDER — DEXTROSE 5 % IV SOLN
1.0000 g | Freq: Once | INTRAVENOUS | Status: AC
  Administered 2011-07-01: 1 g via INTRAVENOUS
  Filled 2011-07-01: qty 10

## 2011-07-01 MED ORDER — SODIUM CHLORIDE 0.9 % IV SOLN: INTRAVENOUS | Status: DC

## 2011-07-01 MED ORDER — DEXTROSE-NACL 5-0.45 % IV SOLN: INTRAVENOUS | Status: DC

## 2011-07-01 MED ORDER — SODIUM CHLORIDE 0.9 % IV SOLN
Freq: Once | INTRAVENOUS | Status: AC
  Administered 2011-07-01: 23:00:00 via INTRAVENOUS

## 2011-07-01 MED ORDER — SODIUM CHLORIDE 0.9 % IV BOLUS (SEPSIS)
500.0000 mL | Freq: Once | INTRAVENOUS | Status: AC
  Administered 2011-07-01: 500 mL via INTRAVENOUS

## 2011-07-01 MED ORDER — DEXTROSE 50 % IV SOLN
25.0000 mL | INTRAVENOUS | Status: DC | PRN
  Administered 2011-07-02: 25 mL via INTRAVENOUS
  Filled 2011-07-01: qty 50

## 2011-07-01 MED ORDER — INSULIN REGULAR BOLUS VIA INFUSION
0.0000 [IU] | Freq: Three times a day (TID) | INTRAVENOUS | Status: DC
  Filled 2011-07-01: qty 10

## 2011-07-01 NOTE — ED Notes (Signed)
Pt presents to ED today with increased CBG at home.  Family reports 522 when last checked.

## 2011-07-01 NOTE — ED Provider Notes (Signed)
History  This chart was scribed for Sunnie Nielsen, MD by Bennett Scrape. This patient was seen in room MH08/MH08 and the patient's care was started at 11:14PM.  CSN: [id number]  Arrival date & time 07/01/11  2211   First MD Initiated Contact with Patient 07/01/11 2305    Level 5 Caveat-Pt has a h/o dementia     Chief Complaint  Patient presents with  . Hyperglycemia    The history is provided by the spouse and a relative. No language interpreter was used.    Colleen Collins is a 78 y.o. female with a h/o dementia but not able to perform ADLs and diabetes who presents to the Emergency Department complaining of hyperglycemia and increased confusion today. Per family, pt's CBG has been steadily increasing over the past 4 hours. They checked it first at 411 after a dinner of chicken and apples and then again at 522 two hours later. Son reports that the pt takes glyburide for sugars and that he has been giving them to her as prescribed. Son states that pt's CBG normally states between 130 and 189.  Family also c/o 2 weeks of gradual onset, gradually worsening non-productive coughing and congestion. Family denies fever, emesis, diarrhea and rash as associated symptoms. Husband reports that he gave her one mucinex last night with no improvement in symptoms. Husband states that he was admitted to [facility] for bronchitis last week.  Per family, pt's last hospitalization was 4 months ago. Pt also has a h/o hyperlipidemia and arrhythmia. Per family, pt is a snuff user but hasn't used in several months.  Dr. Sander Radon with [facility] at [facility].   Past Medical History  Diagnosis Date  . Allergy   . GERD (gastroesophageal reflux disease)   . Chronic kidney disease     stones  . Ulcer   . Hyperlipidemia   . Hx of colonic polyps   . Arrhythmia   . Diabetes mellitus   . Dementia     Past Surgical History  Procedure Date  . Tonsillectomy   . Cataract extraction     Family History  Problem  Relation Age of Onset  . Diabetes      fhx  . Kidney disease      fhx  . Sudden death      fhx    History  Substance Use Topics  . Smoking status: Never Smoker   . Smokeless tobacco: Current User    Types: Snuff  . Alcohol Use: No     Review of Systems  Unable to perform ROS: Dementia  Constitutional: Negative for fever and chills.  HENT: Positive for congestion (x2 weeks).   Respiratory: Positive for cough (x2 weeks).   Gastrointestinal: Negative for vomiting and diarrhea.  Skin: Negative for rash.  Psychiatric/Behavioral: Positive for confusion (Increased today).    Allergies  Review of patient's allergies indicates no known allergies.  Home Medications   Current Outpatient Rx  Name Route Sig Dispense Refill  . GLYBURIDE 5 MG PO TABS Oral Take 1 tablet (5 mg total) by mouth 2 (two) times daily with a meal. 180 tablet 3  . PIOGLITAZONE HCL 45 MG PO TABS Oral Take 45 mg by mouth daily.    Marland Kitchen SIMVASTATIN 40 MG PO TABS Oral Take 1 tablet (40 mg total) by mouth daily. 90 tablet 3    Triage Vitals: BP 146/56  Pulse 67  Temp(Src) 98.2 F (36.8 C) (Oral)  Resp 20  SpO2 95%  Physical Exam  Nursing note and vitals reviewed. Constitutional: She appears well-developed and well-nourished.  HENT:  Head: Normocephalic and atraumatic.  Eyes: Conjunctivae and EOM are normal.  Neck: Neck supple. No tracheal deviation present.  Cardiovascular: Normal rate and regular rhythm.   Pulmonary/Chest: Effort normal and breath sounds normal. No respiratory distress. She has no wheezes. She has no rales.       Dry cough, mildly coarse bilateral breath sounds, good air exchange  Abdominal: Soft. She exhibits no distension.  Musculoskeletal: She exhibits no edema.  Skin: Skin is warm and dry.    ED Course  Procedures (including critical care time)  DIAGNOSTIC STUDIES: Oxygen Saturation is 95% on room air, adequate by my interpretation.    COORDINATION OF CARE: 11:18PM-case  discussed with Dr. Lovell Sheehan on call for [facility]  - agrees to admission   Results for orders placed during the hospital encounter of 07/01/11  GLUCOSE, CAPILLARY      Component Value Range   Glucose-Capillary 468 (*) 70 - 99 (mg/dL)  CBC      Component Value Range   WBC 6.2  4.0 - 10.5 (K/uL)   RBC 3.82 (*) 3.87 - 5.11 (MIL/uL)   Hemoglobin 12.8  12.0 - 15.0 (g/dL)   HCT 16.1  09.6 - 04.5 (%)   MCV 98.7  78.0 - 100.0 (fL)   MCH 33.5  26.0 - 34.0 (pg)   MCHC 34.0  30.0 - 36.0 (g/dL)   RDW 40.9  81.1 - 91.4 (%)   Platelets 251  150 - 400 (K/uL)  DIFFERENTIAL      Component Value Range   Neutrophils Relative 72  43 - 77 (%)   Neutro Abs 4.4  1.7 - 7.7 (K/uL)   Lymphocytes Relative 17  12 - 46 (%)   Lymphs Abs 1.1  0.7 - 4.0 (K/uL)   Monocytes Relative 9  3 - 12 (%)   Monocytes Absolute 0.5  0.1 - 1.0 (K/uL)   Eosinophils Relative 2  0 - 5 (%)   Eosinophils Absolute 0.1  0.0 - 0.7 (K/uL)   Basophils Relative 1  0 - 1 (%)   Basophils Absolute 0.0  0.0 - 0.1 (K/uL)  BASIC METABOLIC PANEL      Component Value Range   Sodium 136  135 - 145 (mEq/L)   Potassium 4.8  3.5 - 5.1 (mEq/L)   Chloride 98  96 - 112 (mEq/L)   CO2 29  19 - 32 (mEq/L)   Glucose, Bld 415 (*) 70 - 99 (mg/dL)   BUN 26 (*) 6 - 23 (mg/dL)   Creatinine, Ser 7.82  0.50 - 1.10 (mg/dL)   Calcium 9.6  8.4 - 95.6 (mg/dL)   GFR calc non Af Amer 65 (*) >90 (mL/min)   GFR calc Af Amer 75 (*) >90 (mL/min)  URINALYSIS, ROUTINE W REFLEX MICROSCOPIC      Component Value Range   Color, Urine YELLOW  YELLOW    APPearance CLEAR  CLEAR    Specific Gravity, Urine 1.034 (*) 1.005 - 1.030    pH 6.5  5.0 - 8.0    Glucose, UA >1000 (*) NEGATIVE (mg/dL)   Hgb urine dipstick NEGATIVE  NEGATIVE    Bilirubin Urine NEGATIVE  NEGATIVE    Ketones, ur NEGATIVE  NEGATIVE (mg/dL)   Protein, ur NEGATIVE  NEGATIVE (mg/dL)   Urobilinogen, UA 0.2  0.0 - 1.0 (mg/dL)   Nitrite NEGATIVE  NEGATIVE    Leukocytes, UA NEGATIVE  NEGATIVE  GLUCOSE, CAPILLARY      Component Value Range   Glucose-Capillary 318 (*) 70 - 99 (mg/dL)  URINE MICROSCOPIC-ADD ON      Component Value Range   Squamous Epithelial / LPF FEW (*) RARE    WBC, UA 0-2  <3 (WBC/hpf)   RBC / HPF 0-2  <3 (RBC/hpf)   Casts HYALINE CASTS (*) NEGATIVE   GLUCOSE, CAPILLARY      Component Value Range   Glucose-Capillary 317 (*) 70 - 99 (mg/dL)   Ct Head Wo Contrast  07/02/2011  *RADIOLOGY REPORT*  Clinical Data: Worsening confusion.  Altered mental status. Diabetes.  CT HEAD WITHOUT CONTRAST  Technique:  Contiguous axial images were obtained from the base of the skull through the vertex without contrast.  Comparison: 10/29/2007  Findings: There is no evidence of intracranial hemorrhage, brain edema or other signs of acute infarction.  There is no evidence of intracranial mass lesion or mass effect.  No abnormal extra-axial fluid collections are identified.  Moderate diffuse cerebral atrophy and chronic small vessel disease shows mild progression since previous study.  No evidence of hydrocephalus.  No skull abnormality identified.  IMPRESSION:  1.  No acute intracranial abnormality. 2.  Mild progression of diffuse cerebral atrophy and chronic small vessel disease.  Original Report Authenticated By: Danae Orleans, M.D.   Dg Chest Portable 1 View  07/02/2011  *RADIOLOGY REPORT*  Clinical Data: Cough.  Chest congestion.  Hyperglycemia.  Increased confusion.  CHEST - 1 VIEW  Comparison:  03/16/2011  Findings: The heart size and mediastinal contours are within normal limits.  Both lungs are clear.  IMPRESSION: No active disease.  Original Report Authenticated By: Danae Orleans, M.D.      MDM  Hyperglycemia and altered metal status. Patient does have history of dementia but worsening mentation and increased confusion at home tonight. Given associated dry cough, antibiotics initiated for possible bronchitis versus early pneumonia. Labs an x-ray obtained and reviewed as  above. no infiltrate on chest x-ray. IV fluids. Serial evaluations unchanged with improving hyperglycemia.    I personally performed the services described in this documentation, which was scribed in my presence. The recorded information has been reviewed and considered.     Sunnie Nielsen, MD 07/02/11 [phone]

## 2011-07-02 ENCOUNTER — Emergency Department (INDEPENDENT_AMBULATORY_CARE_PROVIDER_SITE_OTHER): Payer: Medicare Other

## 2011-07-02 DIAGNOSIS — F29 Unspecified psychosis not due to a substance or known physiological condition: Secondary | ICD-10-CM

## 2011-07-02 DIAGNOSIS — R05 Cough: Secondary | ICD-10-CM

## 2011-07-02 DIAGNOSIS — I10 Essential (primary) hypertension: Secondary | ICD-10-CM | POA: Diagnosis present

## 2011-07-02 DIAGNOSIS — G319 Degenerative disease of nervous system, unspecified: Secondary | ICD-10-CM

## 2011-07-02 DIAGNOSIS — J209 Acute bronchitis, unspecified: Secondary | ICD-10-CM | POA: Diagnosis present

## 2011-07-02 DIAGNOSIS — R0989 Other specified symptoms and signs involving the circulatory and respiratory systems: Secondary | ICD-10-CM

## 2011-07-02 DIAGNOSIS — E119 Type 2 diabetes mellitus without complications: Secondary | ICD-10-CM

## 2011-07-02 DIAGNOSIS — R4182 Altered mental status, unspecified: Secondary | ICD-10-CM

## 2011-07-02 DIAGNOSIS — R7309 Other abnormal glucose: Secondary | ICD-10-CM

## 2011-07-02 LAB — URINALYSIS, ROUTINE W REFLEX MICROSCOPIC
Bilirubin Urine: NEGATIVE
Glucose, UA: >1000 mg/dL — AB
Hgb urine dipstick: NEGATIVE
Protein, ur: NEGATIVE mg/dL
Urobilinogen, UA: 0.2 mg/dL (ref 0.0–1.0)

## 2011-07-02 LAB — GLUCOSE, CAPILLARY
Glucose-Capillary: 188 mg/dL — ABNORMAL HIGH (ref 70–99)
Glucose-Capillary: 317 mg/dL — ABNORMAL HIGH (ref 70–99)
Glucose-Capillary: 321 mg/dL — ABNORMAL HIGH (ref 70–99)
Glucose-Capillary: 76 mg/dL (ref 70–99)

## 2011-07-02 LAB — HEMOGLOBIN A1C: Hgb A1c MFr Bld: 10.2 % — ABNORMAL HIGH (ref <5.7)

## 2011-07-02 LAB — URINE CULTURE
Colony Count: NO GROWTH
Culture: NO GROWTH

## 2011-07-02 LAB — URINE MICROSCOPIC-ADD ON

## 2011-07-02 MED ORDER — INSULIN ASPART 100 UNIT/ML ~~LOC~~ SOLN: 0.0000 [IU] | Freq: Three times a day (TID) | SUBCUTANEOUS | Status: DC

## 2011-07-02 MED ORDER — ACETAMINOPHEN 325 MG PO TABS: 650.0000 mg | ORAL_TABLET | Freq: Four times a day (QID) | ORAL | Status: DC | PRN

## 2011-07-02 MED ORDER — PIOGLITAZONE HCL 45 MG PO TABS
45.0000 mg | ORAL_TABLET | Freq: Every day | ORAL | Status: DC
  Administered 2011-07-02 – 2011-07-03 (×2): 45 mg via ORAL
  Filled 2011-07-02 (×3): qty 1

## 2011-07-02 MED ORDER — INSULIN ASPART 100 UNIT/ML ~~LOC~~ SOLN: 0.0000 [IU] | Freq: Every day | SUBCUTANEOUS | Status: DC

## 2011-07-02 MED ORDER — INSULIN ASPART 100 UNIT/ML ~~LOC~~ SOLN
5.0000 [IU] | Freq: Once | SUBCUTANEOUS | Status: AC
  Administered 2011-07-02: 5 [IU] via SUBCUTANEOUS
  Filled 2011-07-02: qty 3

## 2011-07-02 MED ORDER — CLONAZEPAM 0.5 MG PO TABS
0.5000 mg | ORAL_TABLET | Freq: Every evening | ORAL | Status: DC | PRN
  Administered 2011-07-03: 0.5 mg via ORAL
  Filled 2011-07-02 (×2): qty 1

## 2011-07-02 MED ORDER — GLYBURIDE 2.5 MG PO TABS
2.5000 mg | ORAL_TABLET | ORAL | Status: DC | PRN
  Filled 2011-07-02: qty 1

## 2011-07-02 MED ORDER — GLYBURIDE 2.5 MG PO TABS
2.5000 mg | ORAL_TABLET | Freq: Once | ORAL | Status: DC
  Filled 2011-07-02: qty 1

## 2011-07-02 MED ORDER — ONDANSETRON HCL 4 MG/2ML IJ SOLN: 4.0000 mg | Freq: Four times a day (QID) | INTRAMUSCULAR | Status: DC | PRN

## 2011-07-02 MED ORDER — ONDANSETRON HCL 4 MG PO TABS: 4.0000 mg | ORAL_TABLET | Freq: Four times a day (QID) | ORAL | Status: DC | PRN

## 2011-07-02 MED ORDER — INSULIN ASPART PROT & ASPART (70-30 MIX) 100 UNIT/ML ~~LOC~~ SUSP: 5.0000 [IU] | Freq: Once | SUBCUTANEOUS | Status: DC

## 2011-07-02 MED ORDER — HYDROMORPHONE HCL PF 1 MG/ML IJ SOLN: 0.5000 mg | INTRAMUSCULAR | Status: DC | PRN

## 2011-07-02 MED ORDER — SODIUM CHLORIDE 0.9 % IV SOLN
INTRAVENOUS | Status: DC
  Administered 2011-07-02: 07:00:00 via INTRAVENOUS

## 2011-07-02 MED ORDER — GLYBURIDE 5 MG PO TABS
5.0000 mg | ORAL_TABLET | Freq: Two times a day (BID) | ORAL | Status: DC
  Administered 2011-07-02 – 2011-07-03 (×2): 5 mg via ORAL
  Filled 2011-07-02 (×4): qty 1

## 2011-07-02 MED ORDER — ZOLPIDEM TARTRATE 5 MG PO TABS: 5.0000 mg | ORAL_TABLET | Freq: Every evening | ORAL | Status: DC | PRN

## 2011-07-02 MED ORDER — ENOXAPARIN SODIUM 40 MG/0.4ML ~~LOC~~ SOLN
40.0000 mg | SUBCUTANEOUS | Status: DC
  Administered 2011-07-02 – 2011-07-03 (×2): 40 mg via SUBCUTANEOUS
  Filled 2011-07-02 (×2): qty 0.4

## 2011-07-02 MED ORDER — OXYCODONE HCL 5 MG PO TABS: 5.0000 mg | ORAL_TABLET | ORAL | Status: DC | PRN

## 2011-07-02 MED ORDER — ACETAMINOPHEN 650 MG RE SUPP: 650.0000 mg | Freq: Four times a day (QID) | RECTAL | Status: DC | PRN

## 2011-07-02 NOTE — Progress Notes (Signed)
10:09 AM I agree with HPI/GPe and A/P per Dr. Lovell Sheehan  Very sleepy and has not slept in the past 3 nights. Husband is at bedside keeping watch No new specific issues does awaken and become pLeasantly disoriented     HEENT-no pallor, icterus, confused and not able to tell me where she is. Husband at bedside CHEST clinically clear no added sound CARDIAC S1, S2 no murmur rub or gallop telemetry = sinus rhythm ABDOMEN soft, nontender or distended NEURO cranial nerves II through XII grossly functional. Full neurological exam deferred given significant dementia. Moving all 4 limbs SKIN/MUSCULAR  Patient Active Problem List  Diagnoses  . DIABETES MELLITUS, TYPE II-significant dementia precludes aggressive diabetic care. Her blood sugar however was 522 at [facility], on review of charts she has been on Actos 45 mg by mouth daily and glyburide 5 mg her mouth twice a day with meals. I have requested the husband to give half tablet of glyburide whenever her blood sugar is above 350 and recheck the blood sugar about an hour later. I do think that we need to make sure she does not become hypoglycemic again this admission and will implement orders for patient to receive glyburide on when necessary basis as 2.5 mg if blood sugar above 350. I would discontinue sliding scale insulin given her blood sugar bottomed out to 70 on only 5 unit of regular insulin from transferred from [facility] and she required in dextrose   . HYPERLIPIDEMIA-would not aggressively control-last LDL was 51   . ANXIETY, CHRONIC-usually is temazepam at home. Will defer to outpatient physician with regards to the same   . Alzheimer's disease-profound likely not in candidate for Aricept/other cholinesterase inhibitors   . ALLERGIC RHINITIS  . GERD  . COLONIC POLYPS, HX OF-history of polypectomy 01/2002 with 2 polyps removed, and diverticula. Would monitor and not repeat given significant dementia   . VITAMIN D  DEFICIENCY  . Failure to thrive in adult  . ARF (acute renal failure)-possibly some volume depletion given her baseline BUN and creatinine was 18/0.9 in December. Could potentially discontinue IV fluids in a.m.   . UTI (lower urinary tract infection)  . Hyperglycemia-see above   . Acute bronchitis  . Hypertension-history of. Currently not on any medication    Discuss plan of care with family Likely discharged tomorrow if blood sugar is relatively well controlled  Pleas Koch, MD [facility] [phone]

## 2011-07-02 NOTE — ED Notes (Signed)
I assisted pt with the bedpan

## 2011-07-02 NOTE — H&P (Signed)
DATE OF ADMISSION:  07/02/2011  PCP:    [name], MD, MD   Chief Complaint: Elevated Blood Sugars   HPI: Colleen Collins is an 78 y.o. female who was brought to the [facility] ED due to increasing blood sugars over the past day.  Patient has severe Alzheimers Dementia and lives with her family.  Her family is at the bedside and give the medical history.  Her son states that she has had a chest cold this past week, and he has been giving her mucinex, and robitussin.  She has not had any fevers or chills.   Her Blood sugar was found to be 500 in the ED.     Past Medical History  Diagnosis Date  . Allergy   . GERD (gastroesophageal reflux disease)   . Chronic kidney disease     stones  . Ulcer   . Hyperlipidemia   . Hx of colonic polyps   . Arrhythmia   . Diabetes mellitus   . Dementia     Past Surgical History  Procedure Date  . Tonsillectomy   . Cataract extraction     Medications:  HOME MEDS: Prior to Admission medications   Medication Sig Start Date End Date Taking? Authorizing Provider  glyBURIDE (DIABETA) 5 MG tablet Take 1 tablet (5 mg total) by mouth 2 (two) times daily with a meal. 05/08/11  Yes [name], MD  pioglitazone (ACTOS) 45 MG tablet Take 45 mg by mouth daily.   Yes Historical Provider, MD  simvastatin (ZOCOR) 40 MG tablet Take 1 tablet (40 mg total) by mouth daily. 05/08/11  Yes [name], MD    Allergies:  No Known Allergies  Social History:   reports that she has never smoked. Her smokeless tobacco use includes Snuff. She reports that she does not drink alcohol or use illicit drugs.  Family History: Family History  Problem Relation Age of Onset  . Diabetes      fhx  . Kidney disease      fhx  . Sudden death      fhx    Review of Systems: Unable to obtain from the Patient.      Physical Exam:  GEN:  Pleasant Elderly Obese 78 year old Caucasian female examined  and in no acute distress; cooperative with exam Filed Vitals:     07/01/11 2217 07/02/11 0154 07/02/11 0356  BP: 146/56 135/65 160/69  Pulse: 67 70 64  Temp: 98.2 F (36.8 C)  97.5 F (36.4 C)  TempSrc: Oral  Oral  Resp: 20 18 18   Weight:   50.4 kg (111 lb 1.8 oz)  SpO2: 95% 96% 92%   Blood pressure 160/69, pulse 64, temperature 97.5 F (36.4 C), temperature source Oral, resp. rate 18, weight 50.4 kg (111 lb 1.8 oz), SpO2 92.00%. PSYCH: She is alert and oriented X 1; does not appear anxious does not appear depressed; affect is normal HEENT: Normocephalic and Atraumatic, Mucous membranes pink; PERRLA; EOM intact; Fundi:  Benign;  No scleral icterus, Nares: Patent, Oropharynx: Clear, Fair Dentition, Neck:  FROM, no cervical lymphadenopathy nor thyromegaly or carotid bruit; no JVD; Breasts:: Not examined CHEST WALL: No tenderness CHEST: Normal respiration, clear to auscultation bilaterally HEART: Regular rate and rhythm; no murmurs rubs or gallops BACK: No kyphosis or scoliosis; no CVA tenderness ABDOMEN: Positive Bowel Sounds, Obese, soft non-tender; no masses, no organomegaly. Rectal Exam: Not done EXTREMITIES: No cyanosis, clubbing or edema; no ulcerations. Genitalia: not examined PULSES: 2+ and  symmetric SKIN: Normal hydration no rash or ulceration CNS: Cranial nerves 2-12 grossly intact no focal neurologic deficit   Labs & Imaging Results for orders placed during the hospital encounter of 07/01/11 (from the past 48 hour(s))  GLUCOSE, CAPILLARY     Status: Abnormal   Collection Time   07/01/11 10:18 PM      Component Value Range Comment   Glucose-Capillary 468 (*) 70 - 99 (mg/dL)   CBC     Status: Abnormal   Collection Time   07/01/11 10:50 PM      Component Value Range Comment   WBC 6.2  4.0 - 10.5 (K/uL)    RBC 3.82 (*) 3.87 - 5.11 (MIL/uL)    Hemoglobin 12.8  12.0 - 15.0 (g/dL)    HCT 11.9  14.7 - 82.9 (%)    MCV 98.7  78.0 - 100.0 (fL)    MCH 33.5  26.0 - 34.0 (pg)    MCHC 34.0  30.0 - 36.0 (g/dL)    RDW 56.2  13.0 - 86.5 (%)     Platelets 251  150 - 400 (K/uL)   DIFFERENTIAL     Status: Normal   Collection Time   07/01/11 10:50 PM      Component Value Range Comment   Neutrophils Relative 72  43 - 77 (%)    Neutro Abs 4.4  1.7 - 7.7 (K/uL)    Lymphocytes Relative 17  12 - 46 (%)    Lymphs Abs 1.1  0.7 - 4.0 (K/uL)    Monocytes Relative 9  3 - 12 (%)    Monocytes Absolute 0.5  0.1 - 1.0 (K/uL)    Eosinophils Relative 2  0 - 5 (%)    Eosinophils Absolute 0.1  0.0 - 0.7 (K/uL)    Basophils Relative 1  0 - 1 (%)    Basophils Absolute 0.0  0.0 - 0.1 (K/uL)   BASIC METABOLIC PANEL     Status: Abnormal   Collection Time   07/01/11 10:50 PM      Component Value Range Comment   Sodium 136  135 - 145 (mEq/L)    Potassium 4.8  3.5 - 5.1 (mEq/L)    Chloride 98  96 - 112 (mEq/L)    CO2 29  19 - 32 (mEq/L)    Glucose, Bld 415 (*) 70 - 99 (mg/dL)    BUN 26 (*) 6 - 23 (mg/dL)    Creatinine, Ser 7.84  0.50 - 1.10 (mg/dL)    Calcium 9.6  8.4 - 10.5 (mg/dL)    GFR calc non Af Amer 65 (*) >90 (mL/min)    GFR calc Af Amer 75 (*) >90 (mL/min)   GLUCOSE, CAPILLARY     Status: Abnormal   Collection Time   07/01/11 11:50 PM      Component Value Range Comment   Glucose-Capillary 318 (*) 70 - 99 (mg/dL)   URINALYSIS, ROUTINE W REFLEX MICROSCOPIC     Status: Abnormal   Collection Time   07/02/11 12:01 AM      Component Value Range Comment   Color, Urine YELLOW  YELLOW     APPearance CLEAR  CLEAR     Specific Gravity, Urine 1.034 (*) 1.005 - 1.030     pH 6.5  5.0 - 8.0     Glucose, UA >1000 (*) NEGATIVE (mg/dL)    Hgb urine dipstick NEGATIVE  NEGATIVE     Bilirubin Urine NEGATIVE  NEGATIVE     Ketones, ur NEGATIVE  NEGATIVE (mg/dL)    Protein, ur NEGATIVE  NEGATIVE (mg/dL)    Urobilinogen, UA 0.2  0.0 - 1.0 (mg/dL)    Nitrite NEGATIVE  NEGATIVE     Leukocytes, UA NEGATIVE  NEGATIVE    URINE MICROSCOPIC-ADD ON     Status: Abnormal   Collection Time   07/02/11 12:01 AM      Component Value Range Comment   Squamous  Epithelial / LPF FEW (*) RARE     WBC, UA 0-2  <3 (WBC/hpf)    RBC / HPF 0-2  <3 (RBC/hpf)    Casts HYALINE CASTS (*) NEGATIVE    GLUCOSE, CAPILLARY     Status: Abnormal   Collection Time   07/02/11 12:55 AM      Component Value Range Comment   Glucose-Capillary 317 (*) 70 - 99 (mg/dL)   GLUCOSE, CAPILLARY     Status: Abnormal   Collection Time   07/02/11  2:27 AM      Component Value Range Comment   Glucose-Capillary 321 (*) 70 - 99 (mg/dL)   GLUCOSE, CAPILLARY     Status: Abnormal   Collection Time   07/02/11  4:24 AM      Component Value Range Comment   Glucose-Capillary 217 (*) 70 - 99 (mg/dL)    Ct Head Wo Contrast  07/02/2011  *RADIOLOGY REPORT*  Clinical Data: Worsening confusion.  Altered mental status. Diabetes.  CT HEAD WITHOUT CONTRAST  Technique:  Contiguous axial images were obtained from the base of the skull through the vertex without contrast.  Comparison: 10/29/2007  Findings: There is no evidence of intracranial hemorrhage, brain edema or other signs of acute infarction.  There is no evidence of intracranial mass lesion or mass effect.  No abnormal extra-axial fluid collections are identified.  Moderate diffuse cerebral atrophy and chronic small vessel disease shows mild progression since previous study.  No evidence of hydrocephalus.  No skull abnormality identified.  IMPRESSION:  1.  No acute intracranial abnormality. 2.  Mild progression of diffuse cerebral atrophy and chronic small vessel disease.  Original Report Authenticated By: Danae Orleans, M.D.   Dg Chest Portable 1 View  07/02/2011  *RADIOLOGY REPORT*  Clinical Data: Cough.  Chest congestion.  Hyperglycemia.  Increased confusion.  CHEST - 1 VIEW  Comparison:  03/16/2011  Findings: The heart size and mediastinal contours are within normal limits.  Both lungs are clear.  IMPRESSION: No active disease.  Original Report Authenticated By: Danae Orleans, M.D.      Assessment: Present on Admission:   .Hyperglycemia .DIABETES MELLITUS, TYPE II .Acute bronchitis .Hypertension .Alzheimer's disease .ARF (acute renal failure) .HYPERLIPIDEMIA    Plan:    Admit to Med /Surg. Initially on IV Insulin drip, blood sugars improved, now on SSI coverage. Gentle IVFs Mucinex PRN congestion Reconcile Home Medications DVT prophylaxis. Other plans as per orders.    CODE STATUS:      FULL CODE          Jearlene Bridwell C 07/02/2011, 5:12 AM

## 2011-07-03 LAB — GLUCOSE, CAPILLARY
Glucose-Capillary: 206 mg/dL — ABNORMAL HIGH (ref 70–99)
Glucose-Capillary: 165 mg/dL — ABNORMAL HIGH (ref 70–99)

## 2011-07-03 LAB — BASIC METABOLIC PANEL
CO2: 26 mEq/L (ref 19–32)
Chloride: 106 mEq/L (ref 96–112)
Sodium: 138 mEq/L (ref 135–145)

## 2011-07-03 LAB — CBC
HCT: 33.4 % — ABNORMAL LOW (ref 36.0–46.0)
Hemoglobin: 11.2 g/dL — ABNORMAL LOW (ref 12.0–15.0)
MCV: 98.8 fL (ref 78.0–100.0)
RBC: 3.38 MIL/uL — ABNORMAL LOW (ref 3.87–5.11)
WBC: 4.9 10*3/uL (ref 4.0–10.5)

## 2011-07-03 MED ORDER — CLONAZEPAM 0.5 MG PO TABS: 0.5000 mg | ORAL_TABLET | Freq: Every evening | ORAL | Status: DC | PRN

## 2011-07-03 MED ORDER — GLYBURIDE 5 MG PO TABS: 2.5000 mg | ORAL_TABLET | Freq: Once | ORAL | Status: DC

## 2011-07-03 MED ORDER — GLYBURIDE 5 MG PO TABS: 2.5000 mg | ORAL_TABLET | Freq: Two times a day (BID) | ORAL | Status: DC

## 2011-07-03 NOTE — Discharge Summary (Signed)
[facility] Hospital Discharge Summary  Recommend followup blood sugars and likely education of husband as to how to manage hyperglycemia in the outpatient setting Patient likely would benefit from palliative medicine consult  Date of Admission: 07/01/2011 10:23 PM Admitter: @ADMITPROV @   Date of Discharge3/25/2013 Attending Physician: Rhetta Mura, MD  This pleasant 78 year old demented female who lives with her family states patient had a chest cold and was getting around Mucinex and Robitussin her blood sugars found to be about 500 on admission and patient was given 5 units of insulin. After the 5 units of insulin patient's blood sugar bottomed out to the 70s and it is noted that she is very sensitive to the effects of insulin and patient was admitted to the hospital for further management.  Her basic metabolic panel was normal as was her CBC with a slightly ago hemoglobin of 11.2 her urinalysis was negative except for above 1000 glucose Urine culture showed no growth Chest x-ray 3/24 showed no acute finding CT head 324 = no acute abnormality, mild progression of diffuse cerebral atrophy and chronic small vessel disease Please see below  Hospital Course by problem list: SKIN/MUSCULAR  Patient Active Problem List   Diagnoses   .  DIABETES MELLITUS, TYPE II-significant dementia precludes aggressive diabetic care. Her blood sugar however was 522 at [facility], on review of charts she has been on Actos 45 mg by mouth daily and glyburide 5 mg her mouth twice a day with meals. I have requested the husband to give half tablet of glyburide whenever her blood sugar is above 350 and recheck the blood sugar about an hour later . I do think that we need to make sure she does not become hypoglycemic again this admission and will implement orders for patient to receive glyburide on when necessary basis as 2.5 mg if blood sugar above 350 Her blood sugars remained stable during  hospitalization and she became much more oriented She was at her baseline state. It was felt prudent to discharge her home sooner she did experience some confusion overnight   .   Alzheimer's disease-profound likely not in candidate for Aricept/other cholinesterase inhibitors-she did experience some agitation and confusion overnight and needed clonazepam which time describing for home use. I do feel it is important for patient to have a palliative medicine consult as an outpatient and for the husband who cares for her to help navigate what could be a potentially difficult situation if she does become extremely ill   .  ANXIETY, CHRONIC-usually is temazepam at home. Given a small amount of clonazepam for her to use as an outpatient in view of possible  Dementia with behavior disturbances  .   HYPERLIPIDEMIA-would not aggressively control-last LDL was 51  .  ALLERGIC RHINITIS -stable in hospital   .  GERD -stable in hospital   .  COLONIC POLYPS, HX OF-history of polypectomy 01/2002 with 2 polyps removed, and diverticula. Would monitor and not repeat given significant dementia   .  VITAMIN D DEFICIENCY -stable in hospital   .  Failure to thrive in adult -see above   .  ARF (acute renal failure)-possibly some volume depletion given her baseline BUN and creatinine was 18/0.9 in December. Could potentially discontinue IV fluids in a.m.   Marland Kitchen  Hypertension-history of. Currently not on any medication     Procedures Performed and pertinent labs: Ct Head Wo Contrast  07/02/2011  *RADIOLOGY REPORT*  Clinical Data: Worsening confusion.  Altered mental status.  Diabetes.  CT HEAD WITHOUT CONTRAST  Technique:  Contiguous axial images were obtained from the base of the skull through the vertex without contrast.  Comparison: 10/29/2007  Findings: There is no evidence of intracranial hemorrhage, brain edema or other signs of acute infarction.  There is no evidence of intracranial mass lesion or mass effect.  No  abnormal extra-axial fluid collections are identified.  Moderate diffuse cerebral atrophy and chronic small vessel disease shows mild progression since previous study.  No evidence of hydrocephalus.  No skull abnormality identified.  IMPRESSION:  1.  No acute intracranial abnormality. 2.  Mild progression of diffuse cerebral atrophy and chronic small vessel disease.  Original Report Authenticated By: Danae Orleans, M.D.   Dg Chest Portable 1 View  07/02/2011  *RADIOLOGY REPORT*  Clinical Data: Cough.  Chest congestion.  Hyperglycemia.  Increased confusion.  CHEST - 1 VIEW  Comparison:  03/16/2011  Findings: The heart size and mediastinal contours are within normal limits.  Both lungs are clear.  IMPRESSION: No active disease.  Original Report Authenticated By: Danae Orleans, M.D.    Discharge Vitals & PE:  BP 119/70  Pulse 58  Temp(Src) 97.5 F (36.4 C) (Oral)  Resp 18  Ht 5\' 4"  (1.626 m)  Wt 50.4 kg (111 lb 1.8 oz)  BMI 19.07 kg/m2  SpO2 97%  Discharge Labs:  Results for orders placed during the hospital encounter of 07/01/11 (from the past 24 hour(s))  GLUCOSE, CAPILLARY     Status: Abnormal   Collection Time   07/02/11  3:58 PM      Component Value Range   Glucose-Capillary 335 (*) 70 - 99 (mg/dL)   Comment 1 Notify RN     Comment 2 Documented in Chart    GLUCOSE, CAPILLARY     Status: Abnormal   Collection Time   07/02/11  9:55 PM      Component Value Range   Glucose-Capillary 285 (*) 70 - 99 (mg/dL)  BASIC METABOLIC PANEL     Status: Abnormal   Collection Time   07/03/11  4:40 AM      Component Value Range   Sodium 138  135 - 145 (mEq/L)   Potassium 4.1  3.5 - 5.1 (mEq/L)   Chloride 106  96 - 112 (mEq/L)   CO2 26  19 - 32 (mEq/L)   Glucose, Bld 248 (*) 70 - 99 (mg/dL)   BUN 17  6 - 23 (mg/dL)   Creatinine, Ser 5.32  0.50 - 1.10 (mg/dL)   Calcium 8.8  8.4 - 99.2 (mg/dL)   GFR calc non Af Amer 75 (*) >90 (mL/min)   GFR calc Af Amer 87 (*) >90 (mL/min)  CBC     Status:  Abnormal   Collection Time   07/03/11  4:40 AM      Component Value Range   WBC 4.9  4.0 - 10.5 (K/uL)   RBC 3.38 (*) 3.87 - 5.11 (MIL/uL)   Hemoglobin 11.2 (*) 12.0 - 15.0 (g/dL)   HCT 42.6 (*) 83.4 - 46.0 (%)   MCV 98.8  78.0 - 100.0 (fL)   MCH 33.1  26.0 - 34.0 (pg)   MCHC 33.5  30.0 - 36.0 (g/dL)   RDW 19.6  22.2 - 97.9 (%)   Platelets 209  150 - 400 (K/uL)  GLUCOSE, CAPILLARY     Status: Abnormal   Collection Time   07/03/11  6:57 AM      Component Value Range   Glucose-Capillary  206 (*) 70 - 99 (mg/dL)  GLUCOSE, CAPILLARY     Status: Abnormal   Collection Time   07/03/11 11:51 AM      Component Value Range   Glucose-Capillary 165 (*) 70 - 99 (mg/dL)    Disposition and follow-up:   Ms.Colleen Collins was discharged from in fair condition.    Follow-up Appointments: Follow-up Information    Follow up with [name], MD in 1 week.         Discharge Medications: Medication List  As of 07/03/2011  1:31 PM   TAKE these medications         clonazePAM 0.5 MG tablet   Commonly known as: KLONOPIN   Take 1 tablet (0.5 mg total) by mouth at bedtime as needed (insomnia).      glyBURIDE 5 MG tablet   Commonly known as: DIABETA   Take 1 tablet (5 mg total) by mouth 2 (two) times daily with a meal.      glyBURIDE 5 MG tablet   Commonly known as: DIABETA   Take 0.5 tablets (2.5 mg total) by mouth once.      pioglitazone 45 MG tablet   Commonly known as: ACTOS   Take 45 mg by mouth daily.      simvastatin 40 MG tablet   Commonly known as: ZOCOR   Take 1 tablet (40 mg total) by mouth daily.           Medications Discontinued During This Encounter  Medication Reason  . aluminum-magnesium hydroxide-simethicone (MAALOX) 200-200-20 MG/5ML SUSP Completed Course  . Vitamin D, Ergocalciferol, (DRISDOL) 50000 UNITS CAPS Completed Course  . donepezil (ARICEPT) 23 MG TABS tablet Completed Course  . insulin aspart protamine-insulin aspart (NOVOLOG 70/30) injection 5 Units     . insulin regular bolus via infusion 0-10 Units   . insulin aspart (novoLOG) injection 0-9 Units   . insulin aspart (novoLOG) injection 0-5 Units   . 0.9 %  sodium chloride infusion   . dextrose 5 %-0.45 % sodium chloride infusion   . insulin regular (NOVOLIN R,HUMULIN R) 1 Units/mL in sodium chloride 0.9 % 100 mL infusion   . TDaP (BOOSTRIX) injection 0.5 mL Stop Taking at Discharge  . pneumococcal 23 valent vaccine (PNU-IMMUNE) injection 0.5 mL Stop Taking at Discharge  . glyBURIDE (DIABETA) 5 MG tablet     Signed: Donnelle Olmeda,JAI 07/03/2011, 1:23 PM

## 2011-07-03 NOTE — Progress Notes (Signed)
   CARE MANAGEMENT NOTE 07/03/2011  Patient:  Colleen Collins, Colleen Collins   Account Number:  [id number]  Date Initiated:  07/03/2011  Documentation initiated by:  Darlyne Russian  Subjective/Objective Assessment:   Patient admitted with hyperglycemia     Action/Plan:   Patient lives at home with spouse, who is primary caregivert   Anticipated DC Date:  07/03/2011   Anticipated DC Plan:  HOME W HOME HEALTH SERVICES      DC Planning Services  CM consult      [facility] Choice  HOME HEALTH   Choice offered to / List presented to:  C-3 Spouse        HH arranged  HH-1 RN  HH-4 NURSE'S AIDE      HH agency  [facility]   Status of service:  In process, will continue to follow Medicare Important Message given?   (If response is "NO", the following Medicare IM given date fields will be blank) Date Medicare IM given:   Date Additional Medicare IM given:    Discharge Disposition:    Per UR Regulation:    If discussed at Long Length of Stay Meetings, dates discussed:    Comments:  PCP: Dr Caryl Never  07/03/2011  2:30 pm Darlyne Russian RN, CCM [phone]  Met with patient's spouse to discuss discharge planning. He is the primary caregiver for his wife who has Alzheimer's disease. In the past they had home health RN and an aide for bathing and that was very helpful.  Discussed other home health needs for PT and equipment. He will wait until the patient is home and will talk with the nurse for any other needs. The previous agency was [facility] and would use again. MD order for home health RN and aide. Call to [facility] regarding referral for RN and aide. CM to continue to follow for discharge needs.

## 2011-07-04 NOTE — Progress Notes (Signed)
   CARE MANAGEMENT NOTE 07/04/2011  Patient:  Colleen Collins, Colleen Collins   Account Number:  [id number]  Date Initiated:  07/03/2011  Documentation initiated by:  Darlyne Russian  Subjective/Objective Assessment:   Patient admitted with hyperglycemia     Action/Plan:   Patient lives at home with spouse, who is primary caregivert   Anticipated DC Date:  07/03/2011   Anticipated DC Plan:  HOME W HOME HEALTH SERVICES      DC Planning Services  CM consult      [facility] Choice  HOME HEALTH   Choice offered to / List presented to:  C-3 Spouse        HH arranged  HH-1 RN  HH-4 NURSE'S AIDE      HH agency  [facility]   Status of service:  Completed, signed off Medicare Important Message given?   (If response is "NO", the following Medicare IM given date fields will be blank) Date Medicare IM given:   Date Additional Medicare IM given:    Discharge Disposition:  HOME W HOME HEALTH SERVICES  Per UR Regulation:  Reviewed for med. necessity/level of care/duration of stay  If discussed at Long Length of Stay Meetings, dates discussed:    Comments:  PCP: Dr Caryl Never  UR COMPLETED 07/02/12 Onnie Boer, BSN, RN [phone]  07/03/2011  2:30 pm Darlyne Russian RN, CCM [phone]  Met with patient's spouse to discuss discharge planning. He is the primary caregiver for his wife who has Alzheimer's disease. In the past they had home health RN and an aide for bathing and that was very helpful.  Discussed other home health needs for PT and equipment. He will wait until the patient is home and will talk with the nurse for any other needs. The previous agency was [facility] and would use again. MD order for home health RN and aide. Call to [facility] regarding referral for RN and aide. CM to continue to follow for discharge needs.

## 2011-07-05 ENCOUNTER — Telehealth: Payer: Self-pay | Admitting: *Deleted

## 2011-07-05 NOTE — Telephone Encounter (Signed)
Please call [facility] re: pt's discharge orders on this pt.  Ask for [facility]

## 2011-07-06 NOTE — Telephone Encounter (Signed)
[facility] nurse informed.  Suggested pt come for a post hospital visit, she will contact pt,.

## 2011-07-06 NOTE — Telephone Encounter (Signed)
I spoke with Colleen Collins from [facility].  Some confusion about D/C meds from Hospital visit.  Husband is telling [facility] pt was taken off Actos, she should only be on Gliburide?  Actos is still on pt med list?  Also a question about Aricept, was she on that?  I checked med list and did not see Aricept. Please advise

## 2011-07-06 NOTE — Telephone Encounter (Signed)
I can only go by her hospital d/c summary.  This indicated Actos and NO aricept.  I would not start back Aricept given her degree of dementia.

## 2011-07-12 ENCOUNTER — Ambulatory Visit (INDEPENDENT_AMBULATORY_CARE_PROVIDER_SITE_OTHER): Payer: Medicare Other | Admitting: Family Medicine

## 2011-07-12 ENCOUNTER — Encounter: Payer: Self-pay | Admitting: Family Medicine

## 2011-07-12 VITALS — BP 130/74 | Temp 97.6°F | Ht 61.0 in | Wt 115.0 lb

## 2011-07-12 DIAGNOSIS — E119 Type 2 diabetes mellitus without complications: Secondary | ICD-10-CM

## 2011-07-12 DIAGNOSIS — L89301 Pressure ulcer of unspecified buttock, stage 1: Secondary | ICD-10-CM

## 2011-07-12 DIAGNOSIS — L89309 Pressure ulcer of unspecified buttock, unspecified stage: Secondary | ICD-10-CM

## 2011-07-12 DIAGNOSIS — L8991 Pressure ulcer of unspecified site, stage 1: Secondary | ICD-10-CM

## 2011-07-12 DIAGNOSIS — L8990 Pressure ulcer of unspecified site, unspecified stage: Secondary | ICD-10-CM

## 2011-07-12 DIAGNOSIS — F0280 Dementia in other diseases classified elsewhere without behavioral disturbance: Secondary | ICD-10-CM

## 2011-07-12 NOTE — Progress Notes (Signed)
  Subjective:    Patient ID: Colleen Collins, female    DOB: [birth year], 78 y.o.   MRN: [id number]  HPI  Hospital followup. Patient has history of advanced dementia as well as type 2 diabetes. She was admitted on 07/01/2011 for 2 days. She developed upper respiratory infection. Her blood sugar was elevated over 500. She was given 5 units of insulin in the emergency room and blood sugar reduced to 70s range.  Chest x-ray showed no evidence for pneumonia. Urine culture no growth. CT head no acute abnormality. Patient has listed medications including Actos 45 mg daily but husband states he has not been given this for quite some time. She has been on glyburide 5 mg twice daily. Since hospital discharge her blood sugars been stable mostly below 200. No hypoglycemic symptoms. Recent A1c over 10%.  She has advanced Alzheimer's disease. She has previously been on Aricept but we've explained this is probably offering little to no benefit at this time. Home health care is involved in her care. Husband is very attentive. Patient is sleeping well. No recent agitation. Does have history of chronic insomnia currently on Klonopin 0.5 mg and might which seems to be working well.  No wandering behavior and no agitation.  Patient has small superficial ulcer right buttock. Followed by home health nursing. No history of urine incontinence. Occasional minimal stool incontinence. Appetite is excellent.  Past Medical History  Diagnosis Date  . Allergy   . GERD (gastroesophageal reflux disease)   . Chronic kidney disease     stones  . Ulcer   . Hyperlipidemia   . Hx of colonic polyps   . Arrhythmia   . Diabetes mellitus   . Dementia    Past Surgical History  Procedure Date  . Tonsillectomy   . Cataract extraction     reports that she has never smoked. Her smokeless tobacco use includes Snuff. She reports that she does not drink alcohol or use illicit drugs. family history includes Diabetes in an unspecified family  member; Kidney disease in an unspecified family member; and Sudden death in an unspecified family member. No Known Allergies   Review of Systems  Unobtainable from patient. Patient is eating well. No fever. No complaints of dysuria. Gait is stable.    Objective:   Physical Exam  Constitutional:       Alert pleasant demented elderly female  HENT:  Mouth/Throat: Oropharynx is clear and moist.       Moderate cerumen both canals removed with curette  Neck: Neck supple. No thyromegaly present.  Cardiovascular: Normal rate and regular rhythm.   Pulmonary/Chest: Effort normal and breath sounds normal. No respiratory distress. She has no wheezes. She has no rales.  Musculoskeletal: She exhibits no edema.  Neurological: She is alert. No cranial nerve deficit.  Skin:       Right buttock near the perineum reveals superficial erythema covering an area approximately 1 x 1 cm.          Assessment & Plan:  #1 type 2 diabetes. Recent exacerbation. No clear etiology. Blood sugar is currently stable on glyburide 5 mg twice daily. #2 advanced dementia. Discussed discontinuation of Aricept since this is not providing any clear benefit #3 early stage I ulcer right buttock. Continue home health followup. Continue Desitin for moisture repellent.  Discussed possible [facility] ambulatory assessment.

## 2011-07-12 NOTE — Patient Instructions (Signed)
Touch base for any consistent blood sugars over 350. Follow up for any fever. Continue desitin for water repellent and follow up for any progression in ulcer size or depth

## 2011-07-25 ENCOUNTER — Telehealth: Payer: Self-pay | Admitting: Family Medicine

## 2011-07-25 NOTE — Telephone Encounter (Signed)
OK to give verbal.

## 2011-07-25 NOTE — Telephone Encounter (Signed)
Nurse calling to get verbal order to continue to see Colleen Collins in her home for DM & diet management. Please call her back to give verbal order. Thanks.

## 2011-07-26 NOTE — Telephone Encounter (Signed)
Robin from [facility] verbal OK order given

## 2011-08-07 ENCOUNTER — Ambulatory Visit (INDEPENDENT_AMBULATORY_CARE_PROVIDER_SITE_OTHER): Payer: Medicare Other | Admitting: Family Medicine

## 2011-08-07 ENCOUNTER — Encounter: Payer: Self-pay | Admitting: Family Medicine

## 2011-08-07 ENCOUNTER — Ambulatory Visit: Payer: Medicare Other | Admitting: Family Medicine

## 2011-08-07 VITALS — BP 130/68 | Temp 97.7°F | Wt 113.0 lb

## 2011-08-07 DIAGNOSIS — L8990 Pressure ulcer of unspecified site, unspecified stage: Secondary | ICD-10-CM

## 2011-08-07 DIAGNOSIS — F0280 Dementia in other diseases classified elsewhere without behavioral disturbance: Secondary | ICD-10-CM

## 2011-08-07 DIAGNOSIS — G309 Alzheimer's disease, unspecified: Secondary | ICD-10-CM

## 2011-08-07 DIAGNOSIS — E119 Type 2 diabetes mellitus without complications: Secondary | ICD-10-CM

## 2011-08-07 DIAGNOSIS — L89309 Pressure ulcer of unspecified buttock, unspecified stage: Secondary | ICD-10-CM

## 2011-08-07 DIAGNOSIS — J309 Allergic rhinitis, unspecified: Secondary | ICD-10-CM

## 2011-08-07 NOTE — Progress Notes (Signed)
  Subjective:    Patient ID: Colleen Collins, female    DOB: 23-Sep-1922, 78 y.o.   MRN: [id number]  HPI  Medical followup. Recent small stage I decubitus ulcer buttock.  Followed by [facility]. She is walking frequently and eating well. They stopped her Aricept last visit and she is doing overall very well but he thinks her confusion is worse since stopping this. She never has any problems of agitation.  Diabetes fairly well controlled. Blood sugars consistently less than 200. No hypoglycemic issues. Using glyburide 5 mg twice a day. Also continues on clonazepam 0.5 mg at night and this seems to be working very well.  Past Medical History  Diagnosis Date  . Allergy   . GERD (gastroesophageal reflux disease)   . Chronic kidney disease     stones  . Ulcer   . Hyperlipidemia   . Hx of colonic polyps   . Arrhythmia   . Diabetes mellitus   . Dementia    Past Surgical History  Procedure Date  . Tonsillectomy   . Cataract extraction     reports that she has never smoked. Her smokeless tobacco use includes Snuff. She reports that she does not drink alcohol or use illicit drugs. family history includes Diabetes in an unspecified family member; Kidney disease in an unspecified family member; and Sudden death in an unspecified family member. No Known Allergies    Review of Systems  Constitutional: Negative for appetite change.  Respiratory: Negative for shortness of breath.   Cardiovascular: Negative for chest pain.  Gastrointestinal: Negative for abdominal pain.  Musculoskeletal: Negative for gait problem.  Psychiatric/Behavioral: Positive for confusion (chronic).       Objective:   Physical Exam  Constitutional: She appears well-developed and well-nourished.  Cardiovascular: Normal rate and regular rhythm.   Pulmonary/Chest: Effort normal and breath sounds normal. No respiratory distress. She has no wheezes. She has no rales.  Musculoskeletal: She exhibits no edema.  Skin:       Previous  right buttock ulceration is fully healed at this time          Assessment & Plan:  #1 skin ulcer-fully healed. Continue to monitor #2 Alzheimer's disease. Husband has decided to start back Aricept. He thinks subjectively improvement on Aricept  #3 type 2 diabetes. Stable. Recheck A1c at followup in 3 months. Suggested decrease frequency of blood sugars to 3 times weekly

## 2011-08-28 ENCOUNTER — Encounter: Payer: Self-pay | Admitting: Family Medicine

## 2011-08-28 ENCOUNTER — Ambulatory Visit (INDEPENDENT_AMBULATORY_CARE_PROVIDER_SITE_OTHER): Payer: Medicare Other | Admitting: Family Medicine

## 2011-08-28 VITALS — BP 110/70 | Temp 97.2°F | Wt 114.0 lb

## 2011-08-28 DIAGNOSIS — R350 Frequency of micturition: Secondary | ICD-10-CM

## 2011-08-28 LAB — POCT URINALYSIS DIPSTICK
Bilirubin, UA: NEGATIVE
Glucose, UA: 2000
Leukocytes, UA: NEGATIVE
Nitrite, UA: NEGATIVE
pH, UA: 6

## 2011-08-28 NOTE — Patient Instructions (Signed)
Sources of Caffeine Coffee / Caffeine (mg)  Coffee, brewed, 8 oz / 100 to 200 mg   Coffee, instant, 8 oz / 27 to 173 mg   Espresso, 1 oz / 75 mg  Decaffeinated coffee has 97% of its caffeine removed. Tea / Caffeine (mg)  Tea, brewed, 8 oz / 40 to 120 mg  Caffeine content depends upon the type of tea. Other / Caffeine (mg)  Cola, 12 oz / 35 mg   Chocolate bar, 1.55 oz / 9 mg   Energy drinks, supplements, and pain medicine vary depending on brand. Check the label or with the manufacturer for exact amounts of caffeine.  Document Released: 03/24/2000 Document Revised: 03/16/2011 Document Reviewed: 06/24/2010 [facility] Patient Information 2012 [place], [place].

## 2011-08-28 NOTE — Progress Notes (Signed)
  Subjective:    Patient ID: Colleen Collins, female    DOB: 30-Oct-1922, 78 y.o.   MRN: [id number]  HPI  Urine frequency especially nocturia. Husband has noted over the past week. She has diabetes and blood sugars been fairly well controlled. She is not complaining burning with urination. No fever. Good appetite. Drinking fluids well but drinking lots of caffeinated tea. Most days and she sometimes has up to 4 glasses of caffeinated tea per day. No complaints of back pain.   Review of Systems  Constitutional: Negative for fever and chills.  Genitourinary: Positive for urgency and frequency. Negative for hematuria, flank pain and difficulty urinating.       Objective:   Physical Exam  Constitutional: She appears well-developed and well-nourished.  HENT:  Mouth/Throat: Oropharynx is clear and moist.  Cardiovascular: Normal rate and regular rhythm.   Pulmonary/Chest: Effort normal and breath sounds normal. No respiratory distress. She has no wheezes. She has no rales.          Assessment & Plan:  Urine frequency. Differential is urinary urgency versus caffeine effect. Rather than add more medications, will start with reducing her caffeine gradually. Try to get rid of caffeinated tea. Monitor blood sugars. If symptoms persists after reducing caffeine consider medications such as VESIcare. No evidence for infection by urine dipstick today

## 2011-09-11 ENCOUNTER — Ambulatory Visit: Payer: Medicare Other | Admitting: Family Medicine

## 2011-10-10 ENCOUNTER — Emergency Department (HOSPITAL_BASED_OUTPATIENT_CLINIC_OR_DEPARTMENT_OTHER)
Admission: EM | Admit: 2011-10-10 | Discharge: 2011-10-10 | Disposition: A | Discharge status: Home / Self Care | Payer: Medicare Other | Attending: Emergency Medicine | Admitting: Emergency Medicine

## 2011-10-10 ENCOUNTER — Encounter (HOSPITAL_BASED_OUTPATIENT_CLINIC_OR_DEPARTMENT_OTHER): Payer: Self-pay

## 2011-10-10 DIAGNOSIS — F0390 Unspecified dementia without behavioral disturbance: Secondary | ICD-10-CM | POA: Insufficient documentation

## 2011-10-10 DIAGNOSIS — E119 Type 2 diabetes mellitus without complications: Secondary | ICD-10-CM | POA: Insufficient documentation

## 2011-10-10 DIAGNOSIS — R739 Hyperglycemia, unspecified: Secondary | ICD-10-CM

## 2011-10-10 LAB — URINALYSIS, ROUTINE W REFLEX MICROSCOPIC
Nitrite: NEGATIVE
Specific Gravity, Urine: 1.029 (ref 1.005–1.030)
Urobilinogen, UA: 0.2 mg/dL (ref 0.0–1.0)

## 2011-10-10 LAB — POCT I-STAT 3, ART BLOOD GAS (G3+): pH, Arterial: 7.329 — ABNORMAL LOW (ref 7.350–7.400)

## 2011-10-10 LAB — CBC WITH DIFFERENTIAL/PLATELET
Basophils Absolute: 0 10*3/uL (ref 0.0–0.1)
Basophils Relative: 0 % (ref 0–1)
Hemoglobin: 13.1 g/dL (ref 12.0–15.0)
MCHC: 35 g/dL (ref 30.0–36.0)
Monocytes Relative: 8 % (ref 3–12)
Neutro Abs: 3.3 10*3/uL (ref 1.7–7.7)
Neutrophils Relative %: 66 % (ref 43–77)
RDW: 11.6 % (ref 11.5–15.5)
WBC: 5 10*3/uL (ref 4.0–10.5)

## 2011-10-10 LAB — BASIC METABOLIC PANEL
BUN: 24 mg/dL — ABNORMAL HIGH (ref 6–23)
GFR calc Af Amer: 75 mL/min — ABNORMAL LOW (ref >90)
GFR calc non Af Amer: 65 mL/min — ABNORMAL LOW (ref >90)
Potassium: 4.4 mEq/L (ref 3.5–5.1)
Sodium: 133 mEq/L — ABNORMAL LOW (ref 135–145)

## 2011-10-10 LAB — GLUCOSE, CAPILLARY: Glucose-Capillary: 546 mg/dL — ABNORMAL HIGH (ref 70–99)

## 2011-10-10 LAB — URINE MICROSCOPIC-ADD ON

## 2011-10-10 LAB — KETONES, QUALITATIVE: Acetone, Bld: NEGATIVE

## 2011-10-10 MED ORDER — SODIUM CHLORIDE 0.9 % IV BOLUS (SEPSIS)
2000.0000 mL | Freq: Once | INTRAVENOUS | Status: AC
  Administered 2011-10-10: 2000 mL via INTRAVENOUS

## 2011-10-10 MED ORDER — SODIUM CHLORIDE 0.9 % IV SOLN: INTRAVENOUS | Status: DC

## 2011-10-10 MED ORDER — INSULIN ASPART 100 UNIT/ML ~~LOC~~ SOLN
5.0000 [IU] | Freq: Once | SUBCUTANEOUS | Status: AC
  Administered 2011-10-10: 5 [IU] via SUBCUTANEOUS
  Filled 2011-10-10: qty 3

## 2011-10-10 MED ORDER — INSULIN REGULAR HUMAN 100 UNIT/ML IJ SOLN
INTRAMUSCULAR | Status: AC
  Filled 2011-10-10: qty 1

## 2011-10-10 NOTE — ED Notes (Signed)
BS 400-500 today

## 2011-10-10 NOTE — ED Provider Notes (Signed)
History     CSN: [id number]  Arrival date & time 10/10/11  1635   First MD Initiated Contact with Patient 10/10/11 1732      Chief Complaint  Patient presents with  . Hyperglycemia    (Consider location/radiation/quality/duration/timing/severity/associated sxs/prior treatment) HPI This 78 year old female has advanced dementia and type 2 diabetes and is brought for asymptomatic hyperglycemia with blood sugars approximately 500 range today. She has no polydipsia or polyuria or vomiting fever or change in her baseline confusion rashes cough shortness of breath abdominal pain or other concerns. She is walking around her house without difficulty today. The patient herself has no complaints and is acting at her baseline according to her family. Past Medical History  Diagnosis Date  . Allergy   . GERD (gastroesophageal reflux disease)   . Chronic kidney disease     stones  . Ulcer   . Hyperlipidemia   . Hx of colonic polyps   . Arrhythmia   . Diabetes mellitus   . Dementia     Past Surgical History  Procedure Date  . Tonsillectomy   . Cataract extraction     Family History  Problem Relation Age of Onset  . Diabetes      fhx  . Kidney disease      fhx  . Sudden death      fhx    History  Substance Use Topics  . Smoking status: Never Smoker   . Smokeless tobacco: Current User    Types: Snuff  . Alcohol Use: No    OB History    Grav Para Term Preterm Abortions TAB SAB Ect Mult Living                  Review of Systems  Unable to perform ROS: Dementia    Allergies  Review of patient's allergies indicates no known allergies.  Home Medications   Current Outpatient Rx  Name Route Sig Dispense Refill  . CLONAZEPAM 0.5 MG PO TABS Oral Take 0.5 mg by mouth at bedtime as needed.    . GLYBURIDE 5 MG PO TABS Oral Take 1 tablet (5 mg total) by mouth 2 (two) times daily with a meal. 180 tablet 3  . SIMVASTATIN 40 MG PO TABS Oral Take 1 tablet (40 mg total) by mouth  daily. 90 tablet 3  . CLONAZEPAM 0.5 MG PO TABS Oral Take 1 tablet (0.5 mg total) by mouth at bedtime as needed (insomnia). 30 tablet 0    BP 111/61  Pulse 69  Temp 97.5 F (36.4 C) (Oral)  Resp 17  Ht 5\' 2"  (1.575 m)  Wt 122 lb (55.339 kg)  BMI 22.31 kg/m2  SpO2 100%  Physical Exam  Nursing note and vitals reviewed. Constitutional:       Awake, alert, nontoxic appearance.  HENT:  Head: Atraumatic.  Eyes: Right eye exhibits no discharge. Left eye exhibits no discharge.  Neck: Neck supple.  Cardiovascular: Normal rate and regular rhythm.   No murmur heard. Pulmonary/Chest: Effort normal and breath sounds normal. No respiratory distress. She has no wheezes. She has no rales. She exhibits no tenderness.  Abdominal: Soft. There is no tenderness. There is no rebound.  Musculoskeletal: She exhibits no tenderness.       Baseline ROM, no obvious new focal weakness.  Neurological: She is alert.       Mental status and motor strength appears baseline for patient and situation. Awake alert pleasantly confused.  Skin: No rash noted.  Psychiatric:  She has a normal mood and affect.    ED Course  Procedures (including critical care time)  Labs Reviewed  GLUCOSE, CAPILLARY - Abnormal; Notable for the following:    Glucose-Capillary 546 (*)     All other components within normal limits  BASIC METABOLIC PANEL - Abnormal; Notable for the following:    Sodium 133 (*)     Glucose, Bld 493 (*)     BUN 24 (*)     GFR calc non Af Amer 65 (*)     GFR calc Af Amer 75 (*)     All other components within normal limits  CBC WITH DIFFERENTIAL - Abnormal; Notable for the following:    RBC 3.86 (*)     All other components within normal limits  URINALYSIS, ROUTINE W REFLEX MICROSCOPIC - Abnormal; Notable for the following:    Glucose, UA >1000 (*)     All other components within normal limits  POCT I-STAT 3, BLOOD GAS (G3+) - Abnormal; Notable for the following:    pH, Arterial 7.329 (*)      pCO2 arterial 56.8 (*)     pO2, Arterial 19.0 (*)     Bicarbonate 29.9 (*)     Acid-Base Excess 3.0 (*)     All other components within normal limits  GLUCOSE, CAPILLARY - Abnormal; Notable for the following:    Glucose-Capillary 291 (*)     All other components within normal limits  KETONES, QUALITATIVE  URINE MICROSCOPIC-ADD ON   No results found.   1. Hyperglycemia without ketosis   2. Diabetes mellitus       MDM  Pt stable in ED with no significant deterioration in condition.Patient / Family / Caregiver informed of clinical course, understand medical decision-making process, and agree with plan.        Hurman Horn, MD 10/14/11 1311

## 2011-10-16 ENCOUNTER — Encounter (HOSPITAL_BASED_OUTPATIENT_CLINIC_OR_DEPARTMENT_OTHER): Payer: Self-pay | Admitting: *Deleted

## 2011-10-16 ENCOUNTER — Emergency Department (HOSPITAL_BASED_OUTPATIENT_CLINIC_OR_DEPARTMENT_OTHER)
Admission: EM | Admit: 2011-10-16 | Discharge: 2011-10-16 | Disposition: A | Discharge status: Home / Self Care | Payer: Medicare Other | Attending: Emergency Medicine | Admitting: Emergency Medicine

## 2011-10-16 DIAGNOSIS — Z79899 Other long term (current) drug therapy: Secondary | ICD-10-CM | POA: Insufficient documentation

## 2011-10-16 DIAGNOSIS — K219 Gastro-esophageal reflux disease without esophagitis: Secondary | ICD-10-CM | POA: Insufficient documentation

## 2011-10-16 DIAGNOSIS — R739 Hyperglycemia, unspecified: Secondary | ICD-10-CM

## 2011-10-16 DIAGNOSIS — F0390 Unspecified dementia without behavioral disturbance: Secondary | ICD-10-CM | POA: Insufficient documentation

## 2011-10-16 DIAGNOSIS — E119 Type 2 diabetes mellitus without complications: Secondary | ICD-10-CM | POA: Insufficient documentation

## 2011-10-16 DIAGNOSIS — R350 Frequency of micturition: Secondary | ICD-10-CM | POA: Insufficient documentation

## 2011-10-16 DIAGNOSIS — E785 Hyperlipidemia, unspecified: Secondary | ICD-10-CM | POA: Insufficient documentation

## 2011-10-16 LAB — URINALYSIS, ROUTINE W REFLEX MICROSCOPIC
Bilirubin Urine: NEGATIVE
Hgb urine dipstick: NEGATIVE
Nitrite: NEGATIVE
Specific Gravity, Urine: 1.035 — ABNORMAL HIGH (ref 1.005–1.030)
pH: 6 (ref 5.0–8.0)

## 2011-10-16 LAB — GLUCOSE, CAPILLARY: Glucose-Capillary: 480 mg/dL — ABNORMAL HIGH (ref 70–99)

## 2011-10-16 LAB — BASIC METABOLIC PANEL
BUN: 30 mg/dL — ABNORMAL HIGH (ref 6–23)
Chloride: 95 mEq/L — ABNORMAL LOW (ref 96–112)
GFR calc Af Amer: 65 mL/min — ABNORMAL LOW (ref >90)
Glucose, Bld: 512 mg/dL — ABNORMAL HIGH (ref 70–99)
Potassium: 4.2 mEq/L (ref 3.5–5.1)

## 2011-10-16 LAB — URINE MICROSCOPIC-ADD ON

## 2011-10-16 MED ORDER — METFORMIN HCL 500 MG PO TABS
500.0000 mg | ORAL_TABLET | Freq: Once | ORAL | Status: AC
  Administered 2011-10-16: 500 mg via ORAL
  Filled 2011-10-16: qty 1

## 2011-10-16 MED ORDER — METFORMIN HCL 500 MG PO TABS: 500.0000 mg | ORAL_TABLET | Freq: Two times a day (BID) | ORAL | Status: DC

## 2011-10-16 MED ORDER — SODIUM CHLORIDE 0.9 % IV BOLUS (SEPSIS)
1000.0000 mL | Freq: Once | INTRAVENOUS | Status: AC
  Administered 2011-10-16: 1000 mL via INTRAVENOUS

## 2011-10-16 MED ORDER — FLUCONAZOLE 100 MG PO TABS
150.0000 mg | ORAL_TABLET | Freq: Once | ORAL | Status: AC
  Administered 2011-10-16: 150 mg via ORAL
  Filled 2011-10-16: qty 1

## 2011-10-16 MED ORDER — INSULIN REGULAR HUMAN 100 UNIT/ML IJ SOLN
INTRAMUSCULAR | Status: AC
  Filled 2011-10-16: qty 1

## 2011-10-16 MED ORDER — INSULIN REGULAR HUMAN 100 UNIT/ML IJ SOLN
10.0000 [IU] | Freq: Once | INTRAMUSCULAR | Status: AC
  Administered 2011-10-16: 10 [IU] via SUBCUTANEOUS

## 2011-10-16 NOTE — ED Notes (Signed)
Took patient's Blood sugar and result was: 480. Nurse was notified.

## 2011-10-16 NOTE — ED Provider Notes (Signed)
History     CSN: [id number]  Arrival date & time 10/16/11  4098   First MD Initiated Contact with Patient 10/16/11 0140      Chief Complaint  Patient presents with  . Hyperglycemia    (Consider location/radiation/quality/duration/timing/severity/associated sxs/prior treatment) HPI Level 5 Caveat: dementia. This is a [age]-year-old white female with a history of dementia and diabetes. She was brought here this morning by her family when they noted her sugar to be 580 at home. It was 280 yesterday morning. She has been taking her medications as prescribed. She has not had any fever, chills, nausea, vomiting, diarrhea, cough or dyspnea. She has been urinating frequently and has been drinking a lot of water. She was seen here 6 days ago for the same complaint.  Past Medical History  Diagnosis Date  . Allergy   . GERD (gastroesophageal reflux disease)   . Chronic kidney disease     stones  . Ulcer   . Hyperlipidemia   . Hx of colonic polyps   . Arrhythmia   . Diabetes mellitus   . Dementia     Past Surgical History  Procedure Date  . Tonsillectomy   . Cataract extraction     Family History  Problem Relation Age of Onset  . Diabetes      fhx  . Kidney disease      fhx  . Sudden death      fhx    History  Substance Use Topics  . Smoking status: Never Smoker   . Smokeless tobacco: Former Neurosurgeon    Types: Snuff  . Alcohol Use: No    OB History    Grav Para Term Preterm Abortions TAB SAB Ect Mult Living                  Review of Systems  Unable to perform ROS   Allergies  Review of patient's allergies indicates no known allergies.  Home Medications   Current Outpatient Rx  Name Route Sig Dispense Refill  . GLYBURIDE 5 MG PO TABS Oral Take 1 tablet (5 mg total) by mouth 2 (two) times daily with a meal. 180 tablet 3  . SIMVASTATIN 40 MG PO TABS Oral Take 1 tablet (40 mg total) by mouth daily. 90 tablet 3  . CLONAZEPAM 0.5 MG PO TABS Oral Take 1 tablet (0.5  mg total) by mouth at bedtime as needed (insomnia). 30 tablet 0  . CLONAZEPAM 0.5 MG PO TABS Oral Take 0.5 mg by mouth at bedtime as needed.      BP 157/55  Pulse 72  Temp 97.5 F (36.4 C) (Oral)  Resp 16  Ht 5' (1.524 m)  Wt 114 lb (51.71 kg)  BMI 22.26 kg/m2  SpO2 100%  Physical Exam General: Well-developed, well-nourished female in no acute distress; appearance consistent with age of record HENT: normocephalic, atraumatic Eyes: pupils equal round and reactive to light; extraocular muscles intact Neck: supple Heart: regular rate and rhythm Lungs: clear to auscultation bilaterally Abdomen: soft; nondistended; nontender; no masses or hepatosplenomegaly; bowel sounds present Extremities: No deformity; normal range of motion; pulses normal; no edema Neurologic: Awake, alert, nonverbal; motor function intact in all extremities and symmetric; no facial droop Skin: Warm and dry Psychiatric: Smiles    ED Course  Procedures (including critical care time)    MDM   Nursing notes and vitals signs, including pulse oximetry, reviewed.  Summary of this visit's results, reviewed by myself:  Labs:  Results for orders  placed during the hospital encounter of 10/16/11  GLUCOSE, CAPILLARY      Component Value Range   Glucose-Capillary 561 (*) 70 - 99 mg/dL   Comment 1 Notify RN    URINALYSIS, ROUTINE W REFLEX MICROSCOPIC      Component Value Range   Color, Urine YELLOW  YELLOW   APPearance CLEAR  CLEAR   Specific Gravity, Urine 1.035 (*) 1.005 - 1.030   pH 6.0  5.0 - 8.0   Glucose, UA >1000 (*) NEGATIVE mg/dL   Hgb urine dipstick NEGATIVE  NEGATIVE   Bilirubin Urine NEGATIVE  NEGATIVE   Ketones, ur NEGATIVE  NEGATIVE mg/dL   Protein, ur NEGATIVE  NEGATIVE mg/dL   Urobilinogen, UA 0.2  0.0 - 1.0 mg/dL   Nitrite NEGATIVE  NEGATIVE   Leukocytes, UA NEGATIVE  NEGATIVE  BASIC METABOLIC PANEL      Component Value Range   Sodium 132 (*) 135 - 145 mEq/L   Potassium 4.2  3.5 - 5.1  mEq/L   Chloride 95 (*) 96 - 112 mEq/L   CO2 29  19 - 32 mEq/L   Glucose, Bld 512 (*) 70 - 99 mg/dL   BUN 30 (*) 6 - 23 mg/dL   Creatinine, Ser 1.61  0.50 - 1.10 mg/dL   Calcium 9.6  8.4 - 09.6 mg/dL   GFR calc non Af Amer 56 (*) >90 mL/min   GFR calc Af Amer 65 (*) >90 mL/min  URINE MICROSCOPIC-ADD ON      Component Value Range   Squamous Epithelial / LPF RARE  RARE   WBC, UA 0-2  <3 WBC/hpf   RBC / HPF 0-2  <3 RBC/hpf   Bacteria, UA RARE  RARE   Urine-Other RARE YEAST    GLUCOSE, CAPILLARY      Component Value Range   Glucose-Capillary 480 (*) 70 - 99 mg/dL   Comment 1 Notify RN    GLUCOSE, CAPILLARY      Component Value Range   Glucose-Capillary 345 (*) 70 - 99 mg/dL  GLUCOSE, CAPILLARY      Component Value Range   Glucose-Capillary 237 (*) 70 - 99 mg/dL   0:45 AM Sugar down to 237. Patient is awake and smiling. She has eaten a small meal in the ED. Will restart metformin 500 mg twice daily. Family follow up with Dr. Lucie Leather office today.        Hanley Seamen, MD 10/16/11 [phone]

## 2011-10-16 NOTE — ED Notes (Signed)
See triage note per this RN for assessment. Pt. Has had no complaints per family. There only concern is her blood sugar being elevated. Pt. Denies any complaints. resp even and unlabored. No distress.

## 2011-10-16 NOTE — ED Notes (Signed)
Pt's family states that pt's blood sugar has been elevated for the past week. [facility] Tonight pt's blood sugar was 580. Pt. Takes oral  Diabetic meds. Denies any recent illness. resp even and unlabored. Pt. With hx of dementia.

## 2011-10-16 NOTE — ED Notes (Signed)
Pt. Given a meal to eat.

## 2011-10-16 NOTE — ED Notes (Signed)
Took patients blood sugar and result was:561. Nurse was notified.

## 2011-10-16 NOTE — ED Notes (Signed)
MD at bedside. 

## 2011-10-18 ENCOUNTER — Telehealth: Payer: Self-pay | Admitting: Family Medicine

## 2011-10-18 NOTE — Telephone Encounter (Signed)
Pt was in [facility] on Sunday Night for high bs. Pt was put back on Metformin. [facility] was suppose to call in Metformin to [facility] on [address], but according to pharmacy it has not been done yet. Pls call in asap. Pt is aware that Dr Caryl Never is out of office this wk. Needs to get another doctor to call this med in.

## 2011-10-19 MED ORDER — METFORMIN HCL 500 MG PO TABS: 500.0000 mg | ORAL_TABLET | Freq: Two times a day (BID) | ORAL | Status: DC

## 2011-10-19 NOTE — Telephone Encounter (Signed)
I spoke with husband, they were given a written Rx.  They must have misplaced it.  I did fill med, OV scheduled for 7/29

## 2011-10-21 ENCOUNTER — Encounter (HOSPITAL_BASED_OUTPATIENT_CLINIC_OR_DEPARTMENT_OTHER): Payer: Self-pay | Admitting: Emergency Medicine

## 2011-10-21 ENCOUNTER — Emergency Department (HOSPITAL_BASED_OUTPATIENT_CLINIC_OR_DEPARTMENT_OTHER)
Admission: EM | Admit: 2011-10-21 | Discharge: 2011-10-21 | Disposition: A | Discharge status: Home / Self Care | Payer: Medicare Other | Attending: Emergency Medicine | Admitting: Emergency Medicine

## 2011-10-21 DIAGNOSIS — E119 Type 2 diabetes mellitus without complications: Secondary | ICD-10-CM | POA: Insufficient documentation

## 2011-10-21 DIAGNOSIS — E785 Hyperlipidemia, unspecified: Secondary | ICD-10-CM | POA: Insufficient documentation

## 2011-10-21 DIAGNOSIS — R739 Hyperglycemia, unspecified: Secondary | ICD-10-CM

## 2011-10-21 DIAGNOSIS — Z87891 Personal history of nicotine dependence: Secondary | ICD-10-CM | POA: Insufficient documentation

## 2011-10-21 DIAGNOSIS — K219 Gastro-esophageal reflux disease without esophagitis: Secondary | ICD-10-CM | POA: Insufficient documentation

## 2011-10-21 DIAGNOSIS — F0390 Unspecified dementia without behavioral disturbance: Secondary | ICD-10-CM | POA: Insufficient documentation

## 2011-10-21 DIAGNOSIS — N189 Chronic kidney disease, unspecified: Secondary | ICD-10-CM | POA: Insufficient documentation

## 2011-10-21 LAB — BASIC METABOLIC PANEL
CO2: 29 mEq/L (ref 19–32)
Calcium: 10.1 mg/dL (ref 8.4–10.5)
Creatinine, Ser: 0.8 mg/dL (ref 0.50–1.10)
Glucose, Bld: 420 mg/dL — ABNORMAL HIGH (ref 70–99)

## 2011-10-21 MED ORDER — INSULIN REGULAR HUMAN 100 UNIT/ML IJ SOLN
INTRAMUSCULAR | Status: AC
  Administered 2011-10-21: 5 [IU] via SUBCUTANEOUS
  Filled 2011-10-21: qty 1

## 2011-10-21 MED ORDER — INSULIN REGULAR HUMAN 100 UNIT/ML IJ SOLN
5.0000 [IU] | Freq: Once | INTRAMUSCULAR | Status: AC
  Administered 2011-10-21: 5 [IU] via SUBCUTANEOUS

## 2011-10-21 NOTE — ED Provider Notes (Signed)
History     CSN: [id number]  Arrival date & time 10/21/11  7829   First MD Initiated Contact with Patient 10/21/11 0210      Chief Complaint  Patient presents with  . Hyperglycemia     HPI Pt was seen here this week for same c/o. Pts PMD is on vacation this week. Was given rx for Metformin but family has not gotten it yet. Blood sugar at home this evening 413 and then 493.  Patient states that she is at her baseline and has no complaints of pain, dizziness, or other constitutional symptoms.  Past Medical History  Diagnosis Date  . Allergy   . GERD (gastroesophageal reflux disease)   . Chronic kidney disease     stones  . Ulcer   . Hyperlipidemia   . Hx of colonic polyps   . Arrhythmia   . Diabetes mellitus   . Dementia     Past Surgical History  Procedure Date  . Tonsillectomy   . Cataract extraction   . Cholecystectomy     Family History  Problem Relation Age of Onset  . Diabetes      fhx  . Kidney disease      fhx  . Sudden death      fhx    History  Substance Use Topics  . Smoking status: Never Smoker   . Smokeless tobacco: Former Neurosurgeon    Types: Snuff  . Alcohol Use: No    OB History    Grav Para Term Preterm Abortions TAB SAB Ect Mult Living                  Review of Systems  All other systems reviewed and are negative.    Allergies  Review of patient's allergies indicates no known allergies.  Home Medications   Current Outpatient Rx  Name Route Sig Dispense Refill  . METFORMIN HCL 500 MG PO TABS Oral Take 1 tablet (500 mg total) by mouth 2 (two) times daily with a meal. 60 tablet 2  . CLONAZEPAM 0.5 MG PO TABS Oral Take 1 tablet (0.5 mg total) by mouth at bedtime as needed (insomnia). 30 tablet 0  . CLONAZEPAM 0.5 MG PO TABS Oral Take 0.5 mg by mouth at bedtime as needed.    . GLYBURIDE 5 MG PO TABS Oral Take 1 tablet (5 mg total) by mouth 2 (two) times daily with a meal. 180 tablet 3  . SIMVASTATIN 40 MG PO TABS Oral Take 1 tablet  (40 mg total) by mouth daily. 90 tablet 3    BP 112/66  Pulse 89  Temp 98.6 F (37 C) (Oral)  Resp 16  Ht 5\' 2"  (1.575 m)  Wt 114 lb (51.71 kg)  BMI 20.85 kg/m2  SpO2 99%  Physical Exam  Nursing note and vitals reviewed. Constitutional:       Awake, alert, nontoxic appearance.  HENT:  Head: Atraumatic.  Eyes: Right eye exhibits no discharge. Left eye exhibits no discharge.  Neck: Neck supple.  Cardiovascular: Normal rate and regular rhythm.   No murmur heard. Pulmonary/Chest: Effort normal and breath sounds normal. No respiratory distress. She has no wheezes. She has no rales. She exhibits no tenderness.  Abdominal: Soft. There is no tenderness. There is no rebound.  Musculoskeletal: She exhibits no tenderness.       Baseline ROM, no obvious new focal weakness.  Neurological: She is alert.       Mental status and motor strength appears  baseline for patient and situation. Awake alert pleasantly confused.  Skin: No rash noted.  Psychiatric: She has a normal mood and affect.    ED Course  Procedures (including critical care time)  Labs Reviewed  GLUCOSE, CAPILLARY - Abnormal; Notable for the following:    Glucose-Capillary 472 (*)     All other components within normal limits  BASIC METABOLIC PANEL - Abnormal; Notable for the following:    Glucose, Bld 420 (*)     BUN 25 (*)     GFR calc non Af Amer 65 (*)     GFR calc Af Amer 75 (*)     All other components within normal limits   No results found.   1. Hyperglycemia       MDM  BG is decreasing Patient without complaints.  Family states they will pick up the metformin Rx. Tomorrow.  They want to take patient home.  Will treat with 5 u regular sq.  No AG noted.        Nelia Shi, MD 10/22/11 1043

## 2011-10-21 NOTE — ED Notes (Signed)
Pt was seen earlier last week for elevated glucose, followed up with pcp, script for metformin started, but patient unable to obtain today due to pharmacy closed. Pt glucose at home was 470 she drank a glucerna and it decreased to 420.

## 2011-10-21 NOTE — ED Notes (Signed)
POCT CBG monitoring result was 472, new pt. Override

## 2011-10-21 NOTE — ED Notes (Signed)
Pt was seen here this week for same c/o.  Pts PMD is on vacation this week.  Was given rx for Metformin but family has not gotten it yet. Blood sugar at home this evening 413 and then 493.

## 2011-11-06 ENCOUNTER — Ambulatory Visit: Payer: Medicare Other | Admitting: Family Medicine

## 2011-11-09 ENCOUNTER — Ambulatory Visit (INDEPENDENT_AMBULATORY_CARE_PROVIDER_SITE_OTHER): Payer: Medicare Other | Admitting: Family Medicine

## 2011-11-09 ENCOUNTER — Encounter: Payer: Self-pay | Admitting: Family Medicine

## 2011-11-09 VITALS — BP 130/64 | Temp 97.8°F | Wt 112.0 lb

## 2011-11-09 DIAGNOSIS — G309 Alzheimer's disease, unspecified: Secondary | ICD-10-CM

## 2011-11-09 DIAGNOSIS — E119 Type 2 diabetes mellitus without complications: Secondary | ICD-10-CM

## 2011-11-09 DIAGNOSIS — F0280 Dementia in other diseases classified elsewhere without behavioral disturbance: Secondary | ICD-10-CM

## 2011-11-09 NOTE — Patient Instructions (Addendum)
Monitor blood sugars regularly and if > 400 give 5 units of humalog and repeat blood sugar in one hour.  Call if blood sugar is not decreasing under 400 after one hour.

## 2011-11-09 NOTE — Progress Notes (Signed)
  Subjective:    Patient ID: Colleen Collins, female    DOB: 13-Jul-1922, 78 y.o.   MRN: [id number]  HPI  Patient seen today to discuss diabetes issues. She has advanced dementia and lives with husband. For the most part, he thinks she has been fairly compliant with diet-though her intake is not totally supervised. She's been to the emergency room 3 times over the past month for hyperglycemia. She went on 10/10/2011 with blood sugar 546 then again on 10/16/2011 blood sugar 561 and then on third visit 10/21/2011 blood sugar 472. She takes glyburide 5 mg twice a day and recent addition metformin 500 mg twice a day. At one point, her metformin was held because of acute renal failure. Recent creatinine was less than 1. Husband thinks her blood sugars been somewhat improved since starting metformin. No side effects-such as diarrhea.  She has had no infectious symptoms such as fever or chills. No recent cough. No complaints. There apparently had been some discussion of insulin (for extreme elevations of glucose) per her last ER note but husband has no recollection.  Past Medical History  Diagnosis Date  . Allergy   . GERD (gastroesophageal reflux disease)   . Chronic kidney disease     stones  . Ulcer   . Hyperlipidemia   . Hx of colonic polyps   . Arrhythmia   . Diabetes mellitus   . Dementia    Past Surgical History  Procedure Date  . Tonsillectomy   . Cataract extraction   . Cholecystectomy     reports that she has never smoked. She has quit using smokeless tobacco. Her smokeless tobacco use included Snuff. She reports that she does not drink alcohol or use illicit drugs. family history includes Diabetes in an unspecified family member; Kidney disease in an unspecified family member; and Sudden death in an unspecified family member. No Known Allergies      Review of Systems  Unable to perform ROS      Objective:   Physical Exam  Constitutional: She appears well-developed and  well-nourished.  HENT:  Mouth/Throat: Oropharynx is clear and moist.  Cardiovascular: Normal rate and regular rhythm.   Pulmonary/Chest: Effort normal and breath sounds normal. No respiratory distress. She has no wheezes. She has no rales.  Neurological: She is alert.  Psychiatric:       Patient is demented in no distress          Assessment & Plan:  Type 2 diabetes. History of poor control intermittently. Husband states that most of her blood sugars have been below 200 but she has occasional spikes. He's not sure this is due to poor compliance. Continue metformin and glyburide. We discussed having short acting insulin on hand to take only if her blood sugars escalate over 400. We gave samples of Humalog pen to give 5 units if blood sugar > 400 and recheck CBG one hour later and call if not below 400 then.  We need plan try to keep her out of the emergency room. Recheck A1c today.  Will consult [facility] to assist with patient/family education regarding diabetes management. She has advance dementia and has shown tendencies for somewhat labile diabetes and b/o co-morbidities we are not aiming for tight control.

## 2011-12-06 ENCOUNTER — Ambulatory Visit (INDEPENDENT_AMBULATORY_CARE_PROVIDER_SITE_OTHER): Payer: Medicare Other | Admitting: Family Medicine

## 2011-12-06 ENCOUNTER — Encounter: Payer: Self-pay | Admitting: Family Medicine

## 2011-12-06 ENCOUNTER — Ambulatory Visit (INDEPENDENT_AMBULATORY_CARE_PROVIDER_SITE_OTHER)
Admission: RE | Admit: 2011-12-06 | Discharge: 2011-12-06 | Disposition: A | Discharge status: Home / Self Care | Payer: Medicare Other | Source: Ambulatory Visit | Attending: Family Medicine | Admitting: Family Medicine

## 2011-12-06 VITALS — BP 130/58 | Temp 97.8°F | Wt 111.0 lb

## 2011-12-06 DIAGNOSIS — G309 Alzheimer's disease, unspecified: Secondary | ICD-10-CM

## 2011-12-06 DIAGNOSIS — F0280 Dementia in other diseases classified elsewhere without behavioral disturbance: Secondary | ICD-10-CM

## 2011-12-06 DIAGNOSIS — M25519 Pain in unspecified shoulder: Secondary | ICD-10-CM

## 2011-12-06 DIAGNOSIS — M25511 Pain in right shoulder: Secondary | ICD-10-CM

## 2011-12-06 DIAGNOSIS — E119 Type 2 diabetes mellitus without complications: Secondary | ICD-10-CM

## 2011-12-06 MED ORDER — TRAZODONE HCL 50 MG PO TABS: 25.0000 mg | ORAL_TABLET | Freq: Every evening | ORAL | Status: DC | PRN

## 2011-12-06 NOTE — Progress Notes (Signed)
  Subjective:    Patient ID: Colleen Collins, female    DOB: 19-May-1922, 78 y.o.   MRN: [id number]  HPI  Patient is seen status post fall this past Saturday. Husband noted that she had gotten up and wandered out of bed. She has advanced dementia. He found her nearby on brick floor. She was conscious and did not appear to be in any distress. No change in behavior and no evidence for head trauma. Ambulating without difficulty. He noted some ecchymosis right shoulder and she is now being evaluated for the first time. No mental status changes. Some reluctance to raise right arm since the fall.  She's had some wandering behaviors. Has taken clonazepam 0.5 mg at night without much success. She is generally eating well. Blood sugars been stable. No recent hypoglycemic symptoms.  Past Medical History  Diagnosis Date  . Allergy   . GERD (gastroesophageal reflux disease)   . Chronic kidney disease     stones  . Ulcer   . Hyperlipidemia   . Hx of colonic polyps   . Arrhythmia   . Diabetes mellitus   . Dementia    Past Surgical History  Procedure Date  . Tonsillectomy   . Cataract extraction   . Cholecystectomy     reports that she has never smoked. She has quit using smokeless tobacco. Her smokeless tobacco use included Snuff. She reports that she does not drink alcohol or use illicit drugs. family history includes Diabetes in an unspecified family member; Kidney disease in an unspecified family member; and Sudden death in an unspecified family member. No Known Allergies    Review of Systems  Unable to perform ROS      Objective:   Physical Exam  Constitutional: She appears well-developed and well-nourished.  HENT:  Head: Normocephalic and atraumatic.  Neck: Neck supple.  Cardiovascular: Normal rate and regular rhythm.   Pulmonary/Chest: Effort normal and breath sounds normal. No respiratory distress. She has no wheezes. She has no rales.  Musculoskeletal: She exhibits no edema.   Patient has fairly large area of ecchymosis right anterior lateral shoulder. She has some tenderness distal right clavicle and proximal humerus. She is able to abduct right shoulder about 60 but is reluctant to abduct further secondary to pain          Assessment & Plan:  #1 shoulder ecchymosis and pain following fall. Plain x-rays to rule out fracture. Explained she could have soft tissue injury such as rotator cuff but her age with advanced dementia would not recommend rotator cuff repair. We'll need to work on range of motion and will determine course of action after x-rays. #2 insomnia. Discontinue clonazepam. Try to avoid benzodiazepines secondary to risk of fall. Trazodone 50 mg 1 each bedtime. #3 diabetes.  Stable per husband with no recent hypoglycemia. #4 Dementia.  Stable .  Some wandering behavior.  Discussed risks of meds such as risperdal and have elected to hold off at this time.  X-ray distal clavicle fracture.  Will start some gentle ROM therapy in 2-3 weeks to avoid decreased ROM shoulder.

## 2011-12-06 NOTE — Patient Instructions (Signed)
Hold clonazepam for now.

## 2011-12-07 ENCOUNTER — Other Ambulatory Visit: Payer: Self-pay | Admitting: *Deleted

## 2011-12-07 DIAGNOSIS — M25511 Pain in right shoulder: Secondary | ICD-10-CM

## 2011-12-07 NOTE — Progress Notes (Signed)
Quick Note:  Pt husband informed and will order future home PT for right shoulder pain from fall ______

## 2011-12-08 ENCOUNTER — Other Ambulatory Visit: Payer: Self-pay | Admitting: Family Medicine

## 2011-12-08 DIAGNOSIS — M25519 Pain in unspecified shoulder: Secondary | ICD-10-CM

## 2011-12-14 ENCOUNTER — Telehealth: Payer: Self-pay | Admitting: Family Medicine

## 2011-12-14 ENCOUNTER — Other Ambulatory Visit: Payer: Self-pay | Admitting: Family Medicine

## 2011-12-14 DIAGNOSIS — G309 Alzheimer's disease, unspecified: Secondary | ICD-10-CM

## 2011-12-14 NOTE — Telephone Encounter (Signed)
Referral ordered

## 2011-12-14 NOTE — Telephone Encounter (Signed)
Tiffany, PT, [facility], needing order for Speech Therapy related to dementia, Patient Colleen Collins, DOB 06-Feb-2023.  Spouse states she is declining.  Physical Therapist not with Patient for further assessment.

## 2011-12-14 NOTE — Telephone Encounter (Signed)
OK to get speech therapy consult.

## 2011-12-22 ENCOUNTER — Telehealth: Payer: Self-pay | Admitting: Family Medicine

## 2011-12-22 NOTE — Telephone Encounter (Signed)
Earnest Rosier (Physical Therapist) with [facility] calling regarding an verbal order for patient to have speech therapy.  States she called last week.  Please call her back.  Thanks

## 2011-12-22 NOTE — Telephone Encounter (Signed)
Colleen Collins,  Please see below. If you look in her chart, there is also a telephone note from 9/5, where a referral was done. Please call Tiffany back and let her know there is a referral for speech therapy consult in EPIC and the status of it. Thanks!

## 2012-01-12 ENCOUNTER — Telehealth: Payer: Self-pay | Admitting: Family Medicine

## 2012-01-12 NOTE — Telephone Encounter (Signed)
Morrie Sheldon from [facility] would like a verbal order for home health aide and occupational therapy per family requesting assist with ADLS

## 2012-01-12 NOTE — Telephone Encounter (Signed)
Verbal orders give on phone.

## 2012-01-12 NOTE — Telephone Encounter (Signed)
OK to provide these orders.

## 2012-01-16 ENCOUNTER — Other Ambulatory Visit: Payer: Self-pay | Admitting: Family Medicine

## 2012-01-19 ENCOUNTER — Telehealth: Payer: Self-pay | Admitting: Family Medicine

## 2012-01-19 NOTE — Telephone Encounter (Signed)
I spoke with Tiffany, PT, verbal order given for 1 more visit

## 2012-01-19 NOTE — Telephone Encounter (Signed)
Patient - Colleen Collins , DOB 1922-10-19, PCP- [place].  Tiffany Physical Therapist from [facility] -[phone]) she is requesting an order for one more visit for next week.  She is delivery at [facility] today and Ms.  Buffone has dementia .  She would like another visit to review usage and complete discharge instructions.

## 2012-02-09 ENCOUNTER — Encounter: Payer: Self-pay | Admitting: Family Medicine

## 2012-02-09 ENCOUNTER — Ambulatory Visit (INDEPENDENT_AMBULATORY_CARE_PROVIDER_SITE_OTHER): Payer: Medicare Other | Admitting: Family Medicine

## 2012-02-09 VITALS — BP 120/60 | Temp 98.2°F | Wt 113.0 lb

## 2012-02-09 DIAGNOSIS — L989 Disorder of the skin and subcutaneous tissue, unspecified: Secondary | ICD-10-CM

## 2012-02-09 DIAGNOSIS — E119 Type 2 diabetes mellitus without complications: Secondary | ICD-10-CM

## 2012-02-09 DIAGNOSIS — F0280 Dementia in other diseases classified elsewhere without behavioral disturbance: Secondary | ICD-10-CM

## 2012-02-09 DIAGNOSIS — G309 Alzheimer's disease, unspecified: Secondary | ICD-10-CM

## 2012-02-09 DIAGNOSIS — E785 Hyperlipidemia, unspecified: Secondary | ICD-10-CM

## 2012-02-09 DIAGNOSIS — I10 Essential (primary) hypertension: Secondary | ICD-10-CM

## 2012-02-09 DIAGNOSIS — Z23 Encounter for immunization: Secondary | ICD-10-CM

## 2012-02-09 LAB — BASIC METABOLIC PANEL
BUN: 28 mg/dL — ABNORMAL HIGH (ref 6–23)
Chloride: 103 mEq/L (ref 96–112)
Glucose, Bld: 199 mg/dL — ABNORMAL HIGH (ref 70–99)
Potassium: 5.6 mEq/L — ABNORMAL HIGH (ref 3.5–5.1)

## 2012-02-09 LAB — HEPATIC FUNCTION PANEL
Alkaline Phosphatase: 55 U/L (ref 39–117)
Bilirubin, Direct: 0 mg/dL (ref 0.0–0.3)
Total Bilirubin: 0.4 mg/dL (ref 0.3–1.2)

## 2012-02-09 LAB — LIPID PANEL
LDL Cholesterol: 42 mg/dL (ref 0–99)
Total CHOL/HDL Ratio: 2

## 2012-02-09 NOTE — Progress Notes (Signed)
  Subjective:    Patient ID: Colleen Collins, female    DOB: 07/13/1922, 78 y.o.   MRN: [id number]  HPI  Medical followup. Patient has history of advanced dementia. Very supportive husband. She has type 2 diabetes, hypertension, history of acute renal failure. She's done extremely well recently. Blood sugars have been much better controlled. She remains on metformin and glyburide. No hypoglycemia. Good appetite. No recent falls. Walks frequently.  Hyperlipidemia treated with simvastatin. No recent lipids. Needs flu vaccine. Right nasal scabbed lesion. Patient frequently picks at this. Present for several months. No bleeding. Husband thinks this is not healing because of her constant picking.  Past Medical History  Diagnosis Date  . Allergy   . GERD (gastroesophageal reflux disease)   . Chronic kidney disease     stones  . Ulcer   . Hyperlipidemia   . Hx of colonic polyps   . Arrhythmia   . Diabetes mellitus   . Dementia    Past Surgical History  Procedure Date  . Tonsillectomy   . Cataract extraction   . Cholecystectomy     reports that she has never smoked. She has quit using smokeless tobacco. Her smokeless tobacco use included Snuff. She reports that she does not drink alcohol or use illicit drugs. family history includes Diabetes in an unspecified family member; Kidney disease in an unspecified family member; and Sudden death in an unspecified family member. No Known Allergies   Review of Systems  Constitutional: Negative for fatigue and unexpected weight change.  Eyes: Negative for visual disturbance.  Respiratory: Negative for cough, chest tightness, shortness of breath and wheezing.   Cardiovascular: Negative for chest pain, palpitations and leg swelling.  Gastrointestinal: Negative for abdominal pain.  Neurological: Negative for dizziness, seizures, syncope, weakness, light-headedness and headaches.       Objective:   Physical Exam  Constitutional:       Alert pleasant  demented elderly female. She is smiling frequently during exam  HENT:  Mouth/Throat: Oropharynx is clear and moist.  Neck: Neck supple.  Cardiovascular: Normal rate and regular rhythm.   Pulmonary/Chest: Effort normal and breath sounds normal. No respiratory distress. She has no wheezes. She has no rales.  Musculoskeletal: She exhibits no edema.  Neurological: She is alert.  Skin:       Right naris scabbed area about 6-8 mm.          Assessment & Plan:  #1 advanced dementia. No acute concerns. She remains on trazodone at night which is helping with sleep. No significant agitation. At this point we did not feel medications such as Namenda or Aricept would be very helpful  #2 type 2 diabetes. History of poor control but well controlled by recent home readings. No hypoglycemia. Recheck A1c  #3 hyperlipidemia. Repeat lipid and hepatic panel  #4 health maintenance. Flu vaccine given #5 Right nasal scab.  Discussed with husband this could represent skin cancer such as basal cell or squamous cell.  Discussed possible derm refer and at this point he prefers keeping covered with circular bandaide (hopefully to reduce picking) and will be in touch if not healing next month.

## 2012-02-13 ENCOUNTER — Other Ambulatory Visit: Payer: Self-pay | Admitting: *Deleted

## 2012-02-13 MED ORDER — TRAZODONE HCL 50 MG PO TABS: 25.0000 mg | ORAL_TABLET | Freq: Every evening | ORAL | Status: DC | PRN

## 2012-02-19 LAB — HM DIABETES EYE EXAM

## 2012-03-15 ENCOUNTER — Encounter: Payer: Self-pay | Admitting: Family Medicine

## 2012-04-01 ENCOUNTER — Other Ambulatory Visit: Payer: Self-pay | Admitting: Family Medicine

## 2012-04-01 NOTE — Telephone Encounter (Signed)
Pharmacy informed pt has refills.

## 2012-04-01 NOTE — Telephone Encounter (Signed)
Pt needs refill on trazodone 50 mg call into [address]

## 2012-05-23 ENCOUNTER — Other Ambulatory Visit: Payer: Self-pay | Admitting: Family Medicine

## 2012-05-28 ENCOUNTER — Other Ambulatory Visit: Payer: Self-pay | Admitting: *Deleted

## 2012-05-28 MED ORDER — SIMVASTATIN 40 MG PO TABS: 40.0000 mg | ORAL_TABLET | Freq: Every day | ORAL | Status: DC

## 2012-05-28 MED ORDER — METFORMIN HCL 500 MG PO TABS: 500.0000 mg | ORAL_TABLET | Freq: Two times a day (BID) | ORAL | Status: DC

## 2012-06-21 ENCOUNTER — Other Ambulatory Visit: Payer: Self-pay | Admitting: Family Medicine

## 2012-06-23 NOTE — Telephone Encounter (Signed)
Refill for one year 

## 2012-07-03 ENCOUNTER — Encounter: Payer: Self-pay | Admitting: Family Medicine

## 2012-07-03 ENCOUNTER — Ambulatory Visit (INDEPENDENT_AMBULATORY_CARE_PROVIDER_SITE_OTHER): Payer: Medicare Other | Admitting: Family Medicine

## 2012-07-03 VITALS — BP 110/60 | Temp 97.5°F | Wt 110.0 lb

## 2012-07-03 DIAGNOSIS — E119 Type 2 diabetes mellitus without complications: Secondary | ICD-10-CM

## 2012-07-03 DIAGNOSIS — R300 Dysuria: Secondary | ICD-10-CM

## 2012-07-03 DIAGNOSIS — L989 Disorder of the skin and subcutaneous tissue, unspecified: Secondary | ICD-10-CM

## 2012-07-03 LAB — POCT URINALYSIS DIPSTICK
Spec Grav, UA: 1.015
pH, UA: 5

## 2012-07-03 MED ORDER — CIPROFLOXACIN HCL 250 MG PO TABS: 250.0000 mg | ORAL_TABLET | Freq: Two times a day (BID) | ORAL | Status: DC

## 2012-07-03 NOTE — Progress Notes (Signed)
  Subjective:    Patient ID: Colleen Collins, female    DOB: 05-Dec-1922, 79 y.o.   MRN: [id number]  HPI  Patient seen for possible UTI She has advanced dementia so history is limited. Family had noted that she seemed to be more agitated past couple days. No reported fever. No nausea or vomiting. Also blood sugars have been elevating with fasting around 198 which is unusual for her. Still drinking fluids and eating fairly well. No confirmed fevers.  She has right facial lesion right side of nose. Husband states she frequent scratches of this region. We explained this could be basal cell cancer or even possibly squamous cell but they have been reluctant to consider surgical intervention because of her advanced dementia. I do not think she would be very cooperative.  Past Medical History  Diagnosis Date  . Allergy   . GERD (gastroesophageal reflux disease)   . Chronic kidney disease     stones  . Ulcer   . Hyperlipidemia   . Hx of colonic polyps   . Arrhythmia   . Diabetes mellitus   . Dementia    Past Surgical History  Procedure Laterality Date  . Tonsillectomy    . Cataract extraction    . Cholecystectomy      reports that she has never smoked. She has quit using smokeless tobacco. Her smokeless tobacco use included Snuff. She reports that she does not drink alcohol or use illicit drugs. family history includes Diabetes in an unspecified family member; Kidney disease in an unspecified family member; and Sudden death in an unspecified family member. No Known Allergies    Review of Systems  Constitutional: Negative for fever.   review of systems is very limited because of her advanced dementia     Objective:   Physical Exam  Constitutional: She appears well-developed and well-nourished.  HENT:  Mouth/Throat: Oropharynx is clear and moist.  Cardiovascular: Normal rate.   Pulmonary/Chest: Effort normal and breath sounds normal. No respiratory distress. She has no wheezes.  She has no rales.  Musculoskeletal: She exhibits no edema.  Neurological: She is alert.  Skin:  Right side of nose reveals eschar was somewhat elevated fleshy border. Approximately 3 mm diameter          Assessment & Plan:  #1 probable UTI. Urine culture sent. Start Cipro 250 mg twice daily for 7 days #2 type 2 diabetes. Exacerbation probably related to #1. Continue close monitoring. We have elected against extremely tight control because of her advanced dementia. Followup 3 months and recheck A1c then  #3 probable basal cell cancer right side of nose. Long discussion with family regarding possible further intervention. They're reluctant because of her advanced dementia and concern she might not be cooperative. They are aware this may spread without any further intervention

## 2012-07-03 NOTE — Patient Instructions (Signed)
Urinary Tract Infection Urinary tract infections (UTIs) can develop anywhere along your urinary tract. Your urinary tract is your body's drainage system for removing wastes and extra water. Your urinary tract includes two kidneys, two ureters, a bladder, and a urethra. Your kidneys are a pair of bean-shaped organs. Each kidney is about the size of your fist. They are located below your ribs, one on each side of your spine. CAUSES Infections are caused by microbes, which are microscopic organisms, including fungi, viruses, and bacteria. These organisms are so small that they can only be seen through a microscope. Bacteria are the microbes that most commonly cause UTIs. SYMPTOMS  Symptoms of UTIs may vary by age and gender of the patient and by the location of the infection. Symptoms in young women typically include a frequent and intense urge to urinate and a painful, burning feeling in the bladder or urethra during urination. Older women and men are more likely to be tired, shaky, and weak and have muscle aches and abdominal pain. A fever may mean the infection is in your kidneys. Other symptoms of a kidney infection include pain in your back or sides below the ribs, nausea, and vomiting. DIAGNOSIS To diagnose a UTI, your caregiver will ask you about your symptoms. Your caregiver also will ask to provide a urine sample. The urine sample will be tested for bacteria and white blood cells. White blood cells are made by your body to help fight infection. TREATMENT  Typically, UTIs can be treated with medication. Because most UTIs are caused by a bacterial infection, they usually can be treated with the use of antibiotics. The choice of antibiotic and length of treatment depend on your symptoms and the type of bacteria causing your infection. HOME CARE INSTRUCTIONS  If you were prescribed antibiotics, take them exactly as your caregiver instructs you. Finish the medication even if you feel better after you  have only taken some of the medication.  Drink enough water and fluids to keep your urine clear or pale yellow.  Avoid caffeine, tea, and carbonated beverages. They tend to irritate your bladder.  Empty your bladder often. Avoid holding urine for long periods of time.  Empty your bladder before and after sexual intercourse.  After a bowel movement, women should cleanse from front to back. Use each tissue only once. SEEK MEDICAL CARE IF:   You have back pain.  You develop a fever.  Your symptoms do not begin to resolve within 3 days. SEEK IMMEDIATE MEDICAL CARE IF:   You have severe back pain or lower abdominal pain.  You develop chills.  You have nausea or vomiting.  You have continued burning or discomfort with urination. MAKE SURE YOU:   Understand these instructions.  Will watch your condition.  Will get help right away if you are not doing well or get worse. Document Released: 01/04/2005 Document Revised: 09/26/2011 Document Reviewed: 05/05/2011 [employer] Patient Information 2013 [employer].  

## 2012-07-05 LAB — URINE CULTURE

## 2012-07-10 ENCOUNTER — Telehealth: Payer: Self-pay | Admitting: Family Medicine

## 2012-07-10 NOTE — Telephone Encounter (Signed)
Colleen Collins states they missed a call from [place] in the office  on 07/09/12 regarding Media planner.  Requesting to be called back at [phone].  Triage nurse unable to see note concerning call in EPIC. PLEASE F/U WITH PT regarding missed call.

## 2012-07-10 NOTE — Telephone Encounter (Signed)
This was taken care of earlier this afternoon

## 2012-07-10 NOTE — Progress Notes (Signed)
Quick Note:  Pt husband informed ______

## 2012-07-15 ENCOUNTER — Other Ambulatory Visit: Payer: Self-pay | Admitting: Family Medicine

## 2012-08-08 ENCOUNTER — Ambulatory Visit: Payer: Medicare Other | Admitting: Family Medicine

## 2012-10-04 ENCOUNTER — Ambulatory Visit (INDEPENDENT_AMBULATORY_CARE_PROVIDER_SITE_OTHER): Payer: Medicare Other | Admitting: Family Medicine

## 2012-10-04 VITALS — BP 120/60 | HR 69 | Temp 98.1°F | Resp 18 | Wt 111.0 lb

## 2012-10-04 DIAGNOSIS — L989 Disorder of the skin and subcutaneous tissue, unspecified: Secondary | ICD-10-CM

## 2012-10-04 DIAGNOSIS — E1165 Type 2 diabetes mellitus with hyperglycemia: Secondary | ICD-10-CM | POA: Insufficient documentation

## 2012-10-04 DIAGNOSIS — I10 Essential (primary) hypertension: Secondary | ICD-10-CM

## 2012-10-04 DIAGNOSIS — F0280 Dementia in other diseases classified elsewhere without behavioral disturbance: Secondary | ICD-10-CM

## 2012-10-04 DIAGNOSIS — E119 Type 2 diabetes mellitus without complications: Secondary | ICD-10-CM

## 2012-10-04 LAB — BASIC METABOLIC PANEL
Calcium: 10 mg/dL (ref 8.4–10.5)
GFR: 76.38 mL/min (ref >60.00)
Glucose, Bld: 226 mg/dL — ABNORMAL HIGH (ref 70–99)
Sodium: 137 mEq/L (ref 135–145)

## 2012-10-04 MED ORDER — TRAZODONE HCL 50 MG PO TABS: ORAL_TABLET | ORAL | Status: DC

## 2012-10-04 NOTE — Progress Notes (Signed)
  Subjective:    Patient ID: Colleen Collins, female    DOB: 1922-05-31, 79 y.o.   MRN: [id number]  HPI Here for followup multiple items She has advanced dementia, type 2 diabetes, hyperlipidemia, history of GERD She currently takes metformin and glyburide. Husband states blood sugars are very well controlled with fasting blood sugars usually run 120. No recent suspicion for hypoglycemia. Good appetite. No recent falls.  Right nasal skin lesion. Husband is convinced this is from irritation from her picking.  We expressed concerns last visit about possible skin cancer such as basal cell. This has been nonhealing for several months.  Blood pressures been stable. She remains on simvastatin for hyperlipidemia.  Past Medical History  Diagnosis Date  . Allergy   . GERD (gastroesophageal reflux disease)   . Chronic kidney disease     stones  . Ulcer   . Hyperlipidemia   . Hx of colonic polyps   . Arrhythmia   . Diabetes mellitus   . Dementia    Past Surgical History  Procedure Laterality Date  . Tonsillectomy    . Cataract extraction    . Cholecystectomy      reports that she has never smoked. She has quit using smokeless tobacco. Her smokeless tobacco use included Snuff. She reports that she does not drink alcohol or use illicit drugs. family history includes Diabetes in an unspecified family member; Kidney disease in an unspecified family member; and Sudden death in an unspecified family member. No Known Allergies    Review of Systems  Constitutional: Negative for fatigue and unexpected weight change.  Eyes: Negative for visual disturbance.  Respiratory: Negative for cough, chest tightness, shortness of breath and wheezing.   Cardiovascular: Negative for chest pain, palpitations and leg swelling.  Neurological: Negative for dizziness, seizures, syncope, weakness, light-headedness and headaches.       Objective:   Physical Exam  Constitutional:  Alert pleasant demented elderly  female  Neck: Neck supple.  Cardiovascular: Normal rate and regular rhythm.   Pulmonary/Chest: Effort normal and breath sounds normal. No respiratory distress. She has no wheezes. She has no rales.  Musculoskeletal: She exhibits no edema.  Skin:  Patient has 6-7 mm ulcerated lesion right side of nose with slightly elevated pearly border          Assessment & Plan:  #1 advanced Alzheimer's dementia. Currently stable. We do not feel she would benefit much from Alzheimer's medications at this point issues given advanced state of her condition. #2 type 2 diabetes. Repeat A1c. Our goal here is not extremely tight control given advanced dementia. #3 hyperlipidemia #4 skin lesion right nose. Suspect probable basal cell carcinoma.  Have recommended a referral to [facility]

## 2012-10-07 MED ORDER — METFORMIN HCL 500 MG PO TABS: ORAL_TABLET | ORAL | Status: DC

## 2012-10-07 NOTE — Addendum Note (Signed)
Addended by: Azucena Freed on: 10/07/2012 11:40 AM   Modules accepted: Orders

## 2012-10-07 NOTE — Progress Notes (Signed)
Quick Note:  Called and spoke with pt's spouse. Rx sent to [facility]. ______

## 2012-11-29 IMAGING — CR DG CHEST 2V
2 series · 2 of 2 positions shown · non-contrast
Comparison: 05/21/2010 and earlier.

CLINICAL DATA: 88-year-old female with cough.

CHEST - 2 VIEW

[w chest lat]
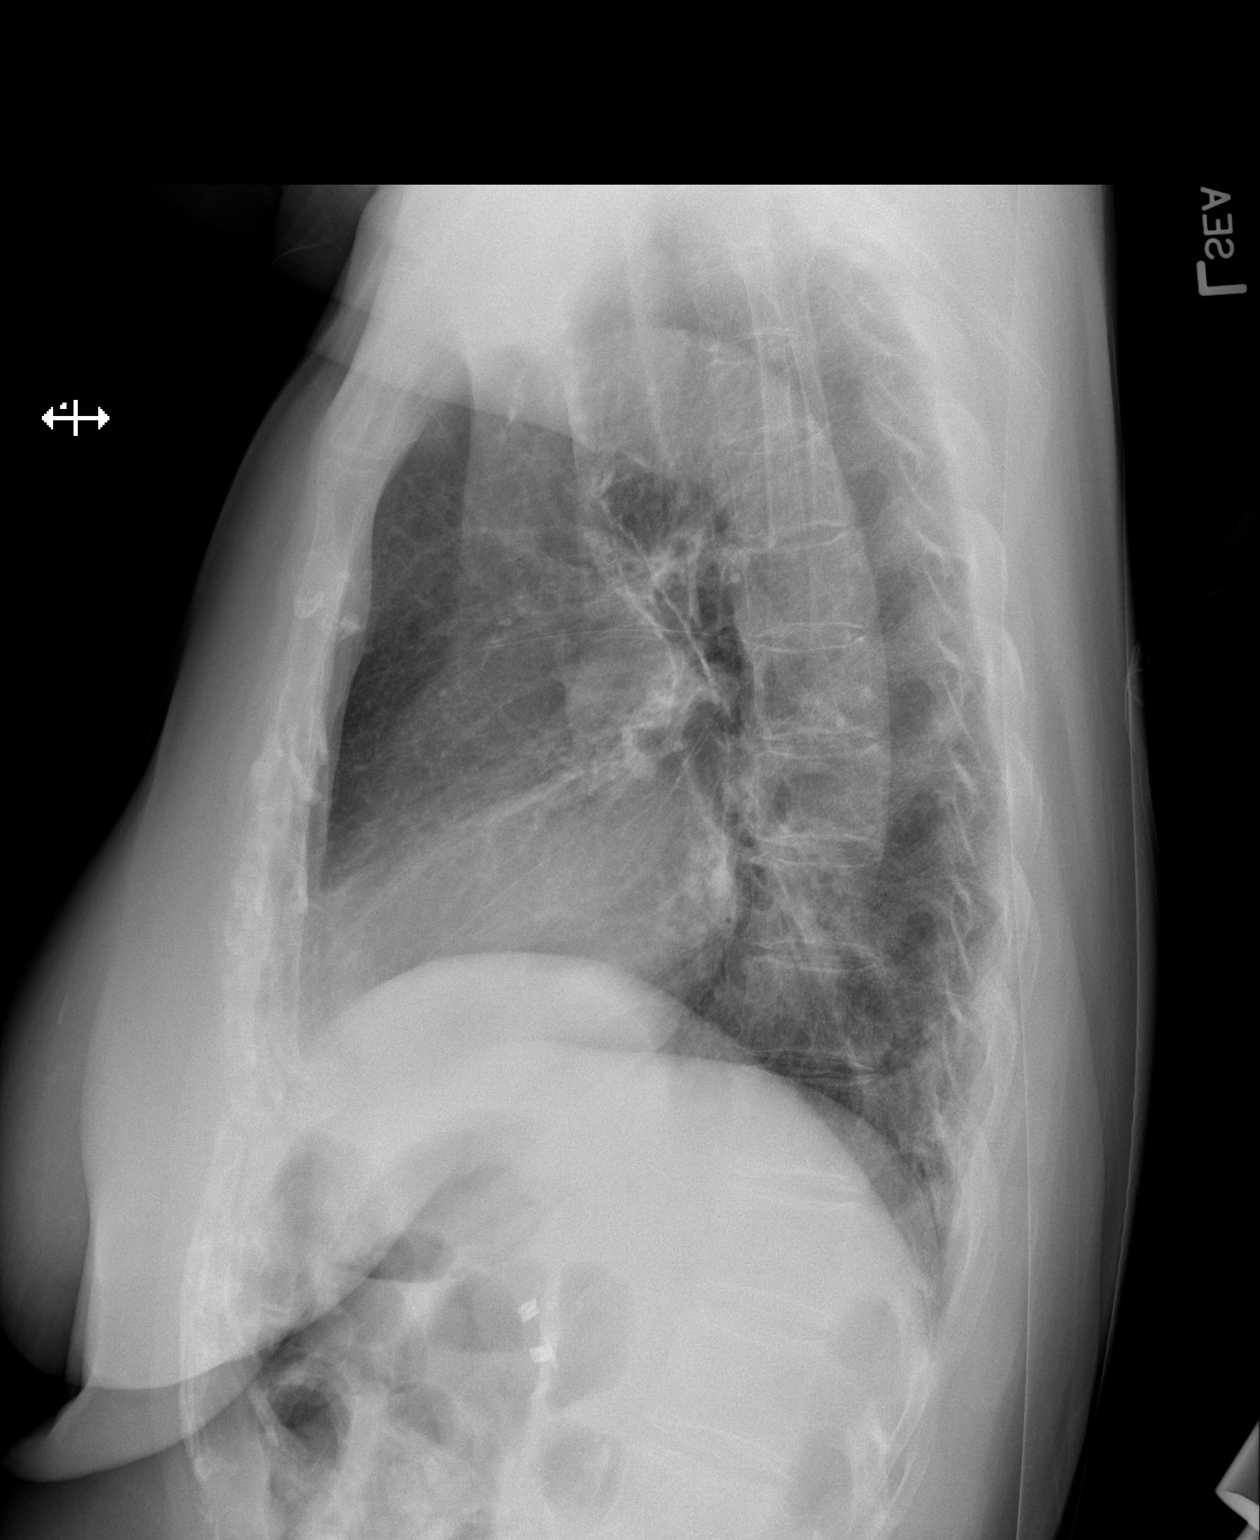

[x chest ap]
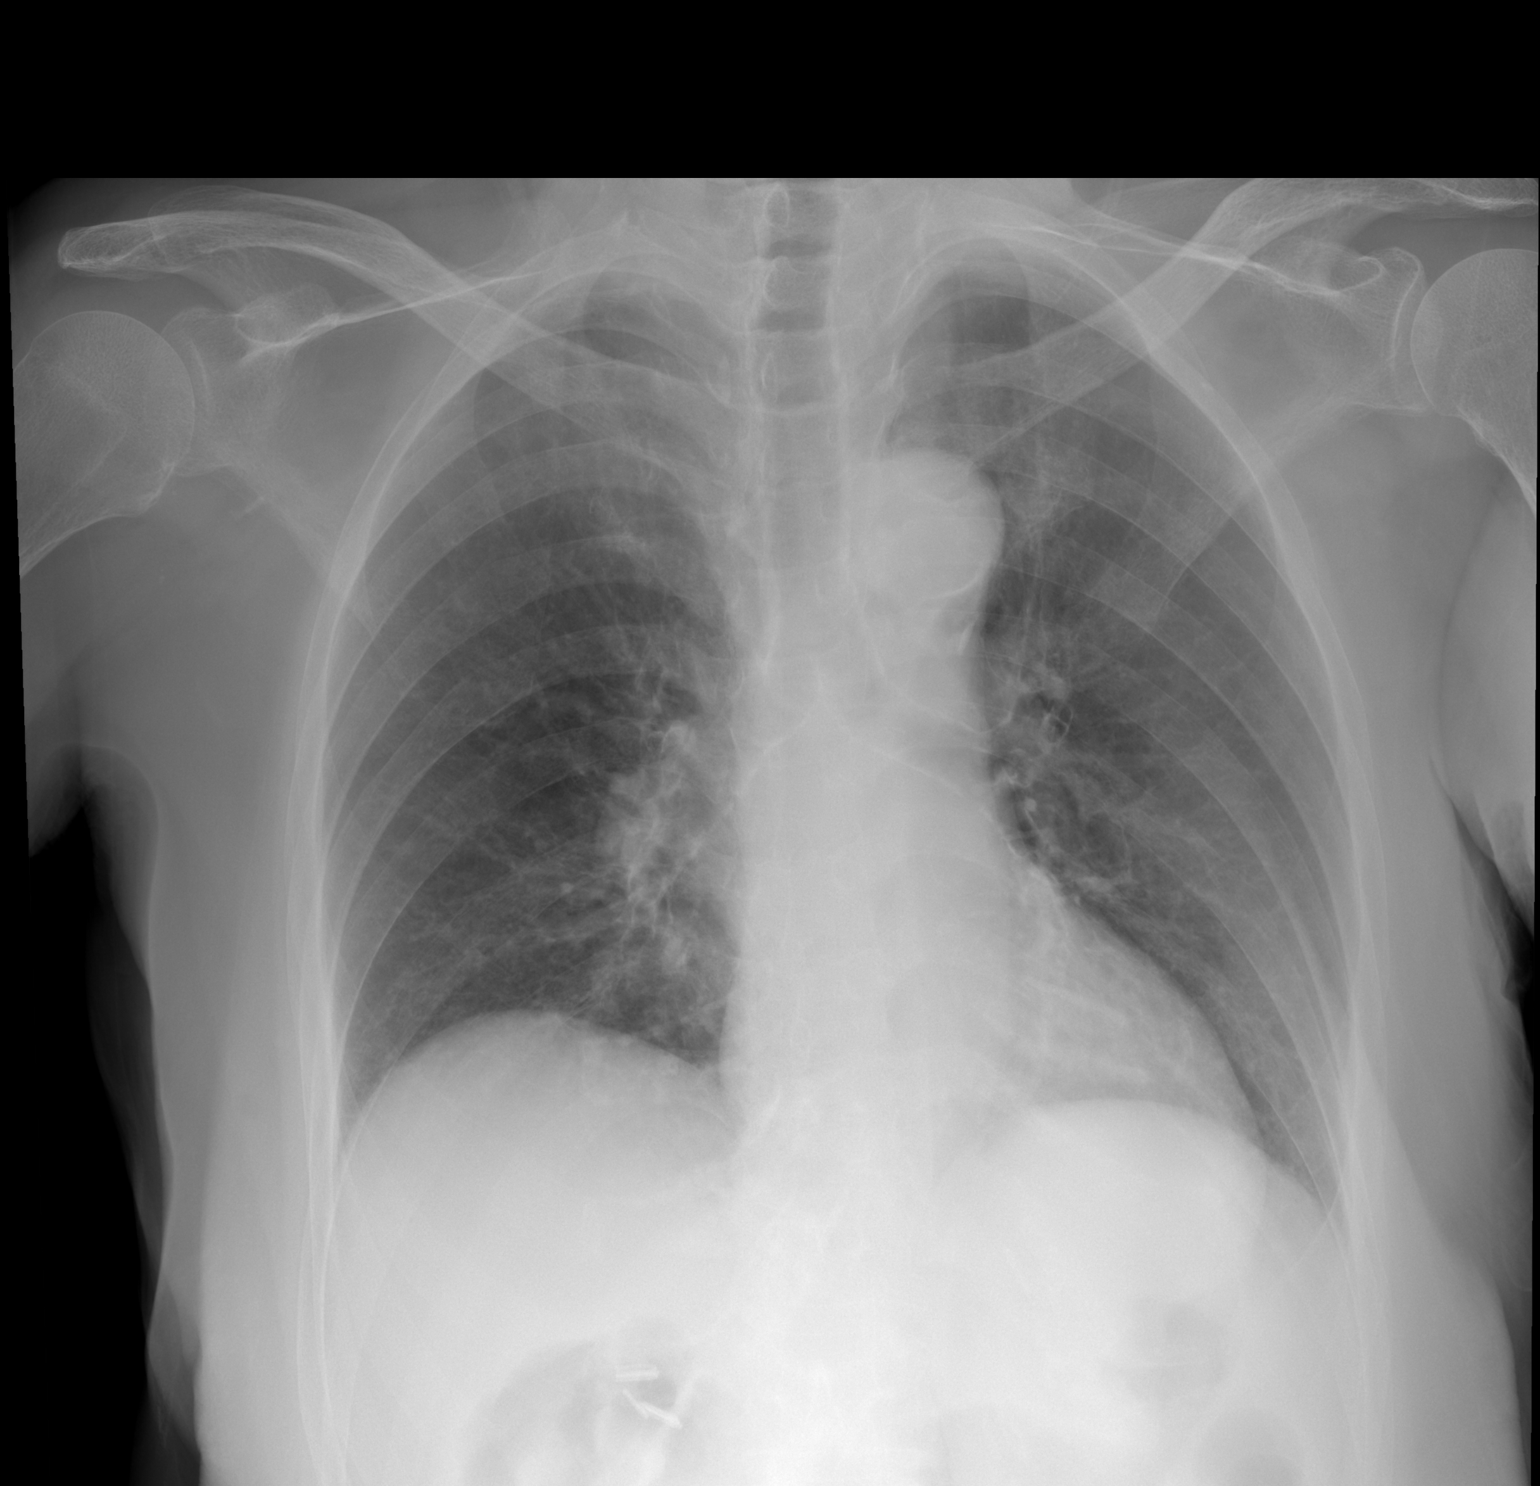

[2 of 2 positions shown; findings below may reference images not displayed]

FINDINGS: Seated AP and lateral views of the chest.  Lower lung
volumes.  Cardiac size and mediastinal contours are within normal
limits.  No pneumothorax, pulmonary edema, pleural effusion or
acute pulmonary opacity.  Right upper quadrant surgical clips. No
acute osseous abnormality identified.
IMPRESSION: No acute cardiopulmonary abnormality.

## 2012-12-02 ENCOUNTER — Telehealth: Payer: Self-pay | Admitting: Family Medicine

## 2012-12-02 MED ORDER — METFORMIN HCL 500 MG PO TABS: ORAL_TABLET | ORAL | Status: DC

## 2012-12-02 NOTE — Telephone Encounter (Signed)
Please call into [facility] [address] metformin 500 mg #9 only Pt is waiting on mailorder

## 2012-12-02 NOTE — Telephone Encounter (Signed)
Rx sent to pharmacy   

## 2013-01-03 ENCOUNTER — Ambulatory Visit (INDEPENDENT_AMBULATORY_CARE_PROVIDER_SITE_OTHER): Payer: Medicare Other

## 2013-01-03 DIAGNOSIS — Z23 Encounter for immunization: Secondary | ICD-10-CM

## 2013-01-28 ENCOUNTER — Telehealth: Payer: Self-pay | Admitting: Family Medicine

## 2013-01-28 NOTE — Telephone Encounter (Signed)
Noted appt made.

## 2013-01-28 NOTE — Telephone Encounter (Signed)
Pt's husband needs appt for pt asap due to her ft swelling. Also states pt's feet has red spots on them. Advised pt to talk to a nurse.

## 2013-01-28 NOTE — Telephone Encounter (Signed)
Patient Information:  Caller Name: Sharlet Salina  Phone: [phone]  Patient: Colleen Collins  Gender: Female  DOB: 08-03-1922  Age: 79 Years  PCP: Evelena Peat (Family Practice)  Office Follow Up:  Does the office need to follow up with this patient?: No  Instructions For The Office: N/A  RN Note:  Right foot "spots" x 4 days.  Afebrile.  Wants appt wtih Dr. Caryl Never.  Red spots are around the ankle, and since she is diabetic, he is worried about them.  Per foot pain protocol, emergent symptoms denied; advised appt within 72 hours.  Appt scheduled 01/29/13 1345 with Dr. Caryl Never.  krs/can  Symptoms  Reason For Call & Symptoms: right foot swelling ? infection  Reviewed Health History In EMR: Yes  Reviewed Medications In EMR: Yes  Reviewed Allergies In EMR: Yes  Reviewed Surgeries / Procedures: Yes  Date of Onset of Symptoms: 01/25/2013  Guideline(s) Used:  Foot Pain  Disposition Per Guideline:   See Within 3 Days in Office  Reason For Disposition Reached:   Patient wants to be seen  Advice Given:  N/A  Patient Will Follow Care Advice:  YES  Appointment Scheduled:  01/29/2013 13:45:00 Appointment Scheduled Provider:  Evelena Peat (Family Practice)

## 2013-01-29 ENCOUNTER — Ambulatory Visit: Payer: Self-pay | Admitting: Family Medicine

## 2013-01-29 NOTE — Telephone Encounter (Signed)
Are there any openings with the other providers.

## 2013-02-11 NOTE — Telephone Encounter (Signed)
Done

## 2013-02-12 ENCOUNTER — Encounter: Payer: Self-pay | Admitting: Family Medicine

## 2013-02-12 ENCOUNTER — Ambulatory Visit (INDEPENDENT_AMBULATORY_CARE_PROVIDER_SITE_OTHER): Payer: Medicare Other | Admitting: Family Medicine

## 2013-02-12 VITALS — BP 122/70 | HR 76 | Temp 97.3°F | Wt 108.0 lb

## 2013-02-12 DIAGNOSIS — E119 Type 2 diabetes mellitus without complications: Secondary | ICD-10-CM

## 2013-02-12 DIAGNOSIS — Z0000 Encounter for general adult medical examination without abnormal findings: Secondary | ICD-10-CM

## 2013-02-12 DIAGNOSIS — R627 Adult failure to thrive: Secondary | ICD-10-CM

## 2013-02-12 DIAGNOSIS — E785 Hyperlipidemia, unspecified: Secondary | ICD-10-CM

## 2013-02-12 DIAGNOSIS — I10 Essential (primary) hypertension: Secondary | ICD-10-CM

## 2013-02-12 DIAGNOSIS — E1165 Type 2 diabetes mellitus with hyperglycemia: Secondary | ICD-10-CM

## 2013-02-12 LAB — HEPATIC FUNCTION PANEL
ALT: 29 U/L (ref 0–35)
AST: 29 U/L (ref 0–37)
Albumin: 4.2 g/dL (ref 3.5–5.2)
Bilirubin, Direct: 0 mg/dL (ref 0.0–0.3)
Total Bilirubin: 0.5 mg/dL (ref 0.3–1.2)

## 2013-02-12 LAB — BASIC METABOLIC PANEL
BUN: 19 mg/dL (ref 6–23)
Calcium: 10.1 mg/dL (ref 8.4–10.5)
Creatinine, Ser: 0.8 mg/dL (ref 0.4–1.2)
GFR: 67.99 mL/min (ref >60.00)
Glucose, Bld: 131 mg/dL — ABNORMAL HIGH (ref 70–99)
Sodium: 140 mEq/L (ref 135–145)

## 2013-02-12 LAB — LIPID PANEL
HDL: 65 mg/dL (ref >39.00)
Total CHOL/HDL Ratio: 2
Triglycerides: 63 mg/dL (ref 0.0–149.0)
VLDL: 12.6 mg/dL (ref 0.0–40.0)

## 2013-02-12 LAB — HEMOGLOBIN A1C: Hgb A1c MFr Bld: 8.2 % — ABNORMAL HIGH (ref 4.6–6.5)

## 2013-02-12 MED ORDER — MIRTAZAPINE 7.5 MG PO TABS: 7.5000 mg | ORAL_TABLET | Freq: Every day | ORAL | Status: DC

## 2013-02-12 NOTE — Progress Notes (Signed)
Subjective:    Patient ID: Colleen Collins, female    DOB: [birth year], 79 y.o.   MRN: [id number]  HPI Patient here for Medicare wellness exam and medical followup She has history of advanced dementia, hypertension, hyperlipidemia, type 2 diabetes. She has excellent care from her husband as well as a neighbor who helps frequently  She has been on trazodone for sleep but has recently had some problems with insomnia and getting up to wonder at night. Her current weight is down 3 pounds from last visit. Her husband states her appetite is fair. No recent falls. She had recent basal cell carcinoma right nose. Removed by shave excision. She never underwent Mohs surgery and family decided against this. She has not had evidence of recurrence since the initial shave biopsy.  She's already had flu vaccine. Pneumovax is up-to-date.  Past Medical History  Diagnosis Date  . Allergy   . GERD (gastroesophageal reflux disease)   . Chronic kidney disease     stones  . Ulcer   . Hyperlipidemia   . Hx of colonic polyps   . Arrhythmia   . Diabetes mellitus   . Dementia    Past Surgical History  Procedure Laterality Date  . Tonsillectomy    . Cataract extraction    . Cholecystectomy      reports that she has never smoked. She has quit using smokeless tobacco. Her smokeless tobacco use included Snuff. She reports that she does not drink alcohol or use illicit drugs. family history includes Diabetes in an other family member; Kidney disease in an other family member; Sudden death in an other family member. No Known Allergies  1.  Risk factors based on Past Medical , Social, and Family history reviewed and as above 2.  Limitations in physical activities very sedentary. No recent falls 3.  Depression/mood no depression issues 4.  Hearing no subjective changes 5.  ADLs dependent and basically all ADLs except feeding 6.  Cognitive function (orientation to time and place, language, writing, speech,memory) she  has advanced dementia. Does communicate verbally 7.  Home Safety we discussed reducing fall risk. They have no throw rugs or other high risk 8.  Height, weight, and visual acuity. Minimal weight loss as above. No visual issues. 9.  Counseling we discussed fall risk reduction. Sleep hygiene discussed 10. Recommendation of preventive services. Flu vaccine already given  11. Labs based on risk factors hemoglobin A1c, lipid panel, hepatic panel, basic metabolic panel 12. Care Plan we'll discontinue trazodone. Trial of Remeron 7.5 mg each bedtime    Review of Systems Not obtainable from patient.    Objective:   Physical Exam  Constitutional: She appears well-developed and well-nourished.  HENT:  Mouth/Throat: Oropharynx is clear and moist.  Minimal cerumen both canals  Neck: Neck supple. No thyromegaly present.  Cardiovascular: Normal rate and regular rhythm.   Pulmonary/Chest: Effort normal and breath sounds normal. No respiratory distress. She has no wheezes. She has no rales.  Abdominal: Soft. Bowel sounds are normal. She exhibits no distension and no mass. There is no tenderness. There is no rebound and no guarding.  Genitourinary:  Deferred secondary to age and advanced dementia  Musculoskeletal: She exhibits no edema.  Neurological: She is alert. No cranial nerve deficit.  Skin: No rash noted.  Minimal scar right nose. No evidence for recurrence of basal cell carcinoma          Assessment & Plan:  #1 health maintenance. Flu vaccine RA given. Fall wrist reduction  discussed. #2 type 2 diabetes. We are not aiming for extremely tight control because of her advanced dementia. Recheck A1c. #3 hyperlipidemia. Recheck lipid and hepatic panel #4 advanced dementia with chronic insomnia. Discontinue trazodone. Start Remeron 7.5 mg each bedtime #5 basal cell carcinoma right face. No evidence for recurrence. We've recommended observation

## 2013-02-12 NOTE — Patient Instructions (Signed)
Stop Trazodone and start Remeron 7.5 mg one nightly.

## 2013-03-18 ENCOUNTER — Ambulatory Visit (INDEPENDENT_AMBULATORY_CARE_PROVIDER_SITE_OTHER): Payer: Medicare Other | Admitting: Podiatry

## 2013-03-18 ENCOUNTER — Encounter: Payer: Self-pay | Admitting: Podiatry

## 2013-03-18 VITALS — BP 124/86 | HR 68 | Resp 12

## 2013-03-18 DIAGNOSIS — B351 Tinea unguium: Secondary | ICD-10-CM

## 2013-03-18 DIAGNOSIS — M79609 Pain in unspecified limb: Secondary | ICD-10-CM

## 2013-03-19 NOTE — Progress Notes (Signed)
Colleen Collins presents today stating that she needs her toenails trimmed.  Objective: Pulses remain palpable bilateral nails are thick yellow dystrophic onychomycotic and severely elongated.  Assessment: Pain in limb second onychomycosis.  Plan: Debridement of nails 1 through 5 bilateral.

## 2013-04-04 ENCOUNTER — Other Ambulatory Visit: Payer: Self-pay | Admitting: Family Medicine

## 2013-05-13 ENCOUNTER — Telehealth: Payer: Self-pay | Admitting: Family Medicine

## 2013-05-13 NOTE — Telephone Encounter (Signed)
Appointment give for 2-4-/bmw

## 2013-05-13 NOTE — Telephone Encounter (Signed)
Patient Information:  Caller Name: Homero FellersFrank  Phone: [phone]  Patient: Colleen Collins, Colleen Collins  Gender: Female  DOB: 14-Mar-1923  Age: 89 Years  PCP: Evelena PeatBurchette, [facility])  Office Follow Up:  Does the office need to follow up with this patient?: Yes  Instructions For The Office: Requesting to be seen today  RN Note:  Frank/Spouse calling regarding Patient Colleen Collins.  C/O elevated blood sugar for past 2 days in AM.  Also, has c/o "foul" smelling urine, and states "This is the way it was last time and she had a UTI.  I would like to have her seen today."  Symptoms  Reason For Call & Symptoms: Blood sugar 276 this AM,  c/o foul odor with urination  Reviewed Health History In EMR: Yes  Reviewed Medications In EMR: Yes  Reviewed Allergies In EMR: Yes  Reviewed Surgeries / Procedures: Yes  Date of Onset of Symptoms: 05/06/2013  Guideline(s) Used:  Urination Pain - Female  Diabetes - High Blood Sugar  Disposition Per Guideline:   See Today in Office  Reason For Disposition Reached:   Patient wants to be seen  Advice Given:  N/A  Patient Will Follow Care Advice:  YES

## 2013-05-13 NOTE — Telephone Encounter (Signed)
Noted pt scheduled to come in the office tomorrow 05/14/12 to be seen.

## 2013-05-14 ENCOUNTER — Ambulatory Visit (INDEPENDENT_AMBULATORY_CARE_PROVIDER_SITE_OTHER): Payer: Medicare Other | Admitting: Family Medicine

## 2013-05-14 ENCOUNTER — Encounter: Payer: Self-pay | Admitting: Family Medicine

## 2013-05-14 VITALS — BP 128/68 | HR 82 | Wt 110.0 lb

## 2013-05-14 DIAGNOSIS — R300 Dysuria: Secondary | ICD-10-CM

## 2013-05-14 MED ORDER — CIPROFLOXACIN HCL 250 MG PO TABS: 250.0000 mg | ORAL_TABLET | Freq: Two times a day (BID) | ORAL | Status: DC

## 2013-05-14 NOTE — Patient Instructions (Signed)
Stay well hydrated.  Follow up for fever or increased weakness.

## 2013-05-14 NOTE — Progress Notes (Signed)
Pre visit review using our clinic review tool, if applicable. No additional management support is needed unless otherwise documented below in the visit note. 

## 2013-05-14 NOTE — Progress Notes (Signed)
   Subjective:    Patient ID: Colleen Collins, female    DOB: 1922/05/24, 80 y.o.   MRN: [id number]  HPI Patient has advanced dementia Accompanied by husband. He has concerns because of strong urine odor past couple days. No increased frequency. She has some urine incontinence which has been chronic. No gross hematuria. No fever. No nausea or vomiting. She has generally been feeling well. Sleeping okay. Good appetite. Occasional dry cough.  Type 2 diabetes. Takes metformin and glyburide. No recent hypoglycemia. Blood sugars fasting generally ranging 140 180 in somewhat elevated past couple days.  Past Medical History  Diagnosis Date  . Allergy   . GERD (gastroesophageal reflux disease)   . Chronic kidney disease     stones  . Ulcer   . Hyperlipidemia   . Hx of colonic polyps   . Arrhythmia   . Diabetes mellitus   . Dementia    Past Surgical History  Procedure Laterality Date  . Tonsillectomy    . Cataract extraction    . Cholecystectomy      reports that she has never smoked. She has quit using smokeless tobacco. Her smokeless tobacco use included Snuff. She reports that she does not drink alcohol or use illicit drugs. family history includes Diabetes in an other family member; Kidney disease in an other family member; Sudden death in an other family member. No Known Allergies    Review of Systems  Constitutional: Negative for fever, chills and appetite change.  Gastrointestinal: Negative for nausea, vomiting, abdominal pain, diarrhea and constipation.  Genitourinary: Positive for dysuria. Negative for frequency and hematuria.  Musculoskeletal: Negative for back pain.  Neurological: Negative for dizziness.       Objective:   Physical Exam  Constitutional: She appears well-developed and well-nourished.  HENT:  Mouth/Throat: Oropharynx is clear and moist.  Cardiovascular: Normal rate.   Pulmonary/Chest: Effort normal and breath sounds normal. No respiratory distress. She has  no wheezes. She has no rales.          Assessment & Plan:  Dysuria. History is difficult as she has advanced dementia. She was unable to give urine specimen today. Cup was given to husband to try to collect at home. In the meantime, start Cipro 250 mg twice daily he will try return urine sample either later today or tomorrow.

## 2013-05-15 LAB — POCT URINALYSIS DIPSTICK
Bilirubin, UA: NEGATIVE
Blood, UA: NEGATIVE
Ketones, UA: NEGATIVE
Leukocytes, UA: NEGATIVE
Nitrite, UA: NEGATIVE
PROTEIN UA: NEGATIVE
SPEC GRAV UA: 1.015
UROBILINOGEN UA: 0.2
pH, UA: 5

## 2013-05-15 NOTE — Addendum Note (Signed)
Addended by: Rita OharaHRASHER, Johnpaul Gillentine R on: 05/15/2013 01:57 PM   Modules accepted: Orders

## 2013-05-16 ENCOUNTER — Telehealth: Payer: Self-pay | Admitting: Family Medicine

## 2013-05-16 NOTE — Telephone Encounter (Signed)
Order that was faxed back had testing sequence at 0 times /day and 0 times a mo. Pt is requesting 1 x /day *(30 x mo) edwards is going to fax  order back,  if we could change and initial, then fax back please

## 2013-05-16 NOTE — Telephone Encounter (Signed)
Pt is only taking medications she is not testing

## 2013-05-22 ENCOUNTER — Telehealth: Payer: Self-pay | Admitting: Family Medicine

## 2013-05-22 NOTE — Telephone Encounter (Signed)
Pt hus would like ua results

## 2013-05-22 NOTE — Telephone Encounter (Signed)
Pt informed

## 2013-05-22 NOTE — Telephone Encounter (Signed)
Can you call and check on this please. Patient brought the urine.

## 2013-05-22 NOTE — Telephone Encounter (Signed)
Urine was normal

## 2013-06-09 ENCOUNTER — Other Ambulatory Visit: Payer: Self-pay | Admitting: Family Medicine

## 2013-06-17 ENCOUNTER — Ambulatory Visit (INDEPENDENT_AMBULATORY_CARE_PROVIDER_SITE_OTHER): Payer: Medicare Other | Admitting: Podiatry

## 2013-06-17 ENCOUNTER — Encounter: Payer: Self-pay | Admitting: Podiatry

## 2013-06-17 VITALS — BP 118/68 | HR 70 | Resp 16

## 2013-06-17 DIAGNOSIS — M79609 Pain in unspecified limb: Secondary | ICD-10-CM

## 2013-06-17 DIAGNOSIS — B351 Tinea unguium: Secondary | ICD-10-CM

## 2013-06-17 NOTE — Progress Notes (Signed)
Trim toenails .  Objective: Pulses are palpable bilateral. Nails are thick yellow dystrophic onychomycotic 1 through 5 bilateral.  Assessment: Pain in limb secondary to onychomycosis 1 through 5 bilateral.  Plan: Debridement of nails 1 through 5 bilateral covered service secondary to pain.

## 2013-08-13 ENCOUNTER — Ambulatory Visit: Payer: Medicare Other | Admitting: Family Medicine

## 2013-08-28 ENCOUNTER — Encounter: Payer: Self-pay | Admitting: Family Medicine

## 2013-08-28 ENCOUNTER — Ambulatory Visit (INDEPENDENT_AMBULATORY_CARE_PROVIDER_SITE_OTHER): Payer: Medicare Other | Admitting: Family Medicine

## 2013-08-28 VITALS — BP 120/66 | HR 81 | Wt 115.0 lb

## 2013-08-28 DIAGNOSIS — F0280 Dementia in other diseases classified elsewhere without behavioral disturbance: Secondary | ICD-10-CM

## 2013-08-28 DIAGNOSIS — E1165 Type 2 diabetes mellitus with hyperglycemia: Secondary | ICD-10-CM

## 2013-08-28 DIAGNOSIS — E119 Type 2 diabetes mellitus without complications: Secondary | ICD-10-CM

## 2013-08-28 DIAGNOSIS — G4700 Insomnia, unspecified: Secondary | ICD-10-CM

## 2013-08-28 DIAGNOSIS — G309 Alzheimer's disease, unspecified: Secondary | ICD-10-CM

## 2013-08-28 LAB — HEMOGLOBIN A1C: Hgb A1c MFr Bld: 8.6 % — ABNORMAL HIGH (ref 4.6–6.5)

## 2013-08-28 MED ORDER — ZOLPIDEM TARTRATE 5 MG PO TABS: 5.0000 mg | ORAL_TABLET | Freq: Every evening | ORAL | Status: DC | PRN

## 2013-08-28 MED ORDER — METFORMIN HCL 500 MG PO TABS: ORAL_TABLET | ORAL | Status: DC

## 2013-08-28 MED ORDER — GLYBURIDE 5 MG PO TABS: ORAL_TABLET | ORAL | Status: DC

## 2013-08-28 NOTE — Progress Notes (Signed)
Pre visit review using our clinic review tool, if applicable. No additional management support is needed unless otherwise documented below in the visit note. 

## 2013-08-28 NOTE — Progress Notes (Signed)
   Subjective:    Patient ID: Colleen Collins, female    DOB: January 25, 1923, 80 y.o.   MRN: [id number]  HPI   Medical followup. She has advanced dementia, hypertension, type 2 diabetes, GERD. She's been reasonably stable. Caregivers bring up the issue that she is frequently not sleeping at night. Previously used trazodone but did not help. She currently takes Remeron 7.5 mg of still not sleeping. Appetite has been very good and weight is stable. Diabetes been stable. She remains on glyburide/metformin. No recent hypoglycemic episodes.  Reviewed with no changes:  Past Medical History  Diagnosis Date  . Allergy   . GERD (gastroesophageal reflux disease)   . Chronic kidney disease     stones  . Ulcer   . Hyperlipidemia   . Hx of colonic polyps   . Arrhythmia   . Diabetes mellitus   . Dementia    Past Surgical History  Procedure Laterality Date  . Tonsillectomy    . Cataract extraction    . Cholecystectomy      reports that she has never smoked. She has quit using smokeless tobacco. Her smokeless tobacco use included Snuff. She reports that she does not drink alcohol or use illicit drugs. family history includes Diabetes in an other family member; Kidney disease in an other family member; Sudden death in an other family member. No Known Allergies    Review of Systems Unable to obtain from patient because of advanced dementia    Objective:   Physical Exam  Constitutional: She appears well-developed and well-nourished.  HENT:  Mouth/Throat: Oropharynx is clear and moist.  Cardiovascular: Normal rate and regular rhythm.   Pulmonary/Chest: Effort normal and breath sounds normal. No respiratory distress. She has no wheezes. She has no rales.  Musculoskeletal: She exhibits no edema.  Neurological: She is alert.          Assessment & Plan:  #1 advanced dementia. Patient has excellent caregiver with husband and neighbor.  Would not benefit from dementia meds at this time secondary to  advanced nature of her disease. #2 insomnia. She has not responded to trazodone. She is currently taking Remeron 7.5 mg. We discussed cautious trial of Ambien 5 mg each bedtime.  Sleep hygiene discussed.  Avoid daytime naps. #3 type 2 diabetes. We are not aiming for tight control because of her advanced dementia and age. Recheck A1c. Refill diabetic supplies for one year.

## 2013-09-12 ENCOUNTER — Other Ambulatory Visit: Payer: Self-pay

## 2013-09-12 MED ORDER — LORAZEPAM 0.5 MG PO TABS: 0.5000 mg | ORAL_TABLET | Freq: Every day | ORAL | Status: DC

## 2013-09-12 NOTE — Telephone Encounter (Signed)
Per Dr. Caryl Never discontinue Ambien and start Ativan 0.5mg  one at bedtime. #30 3 refills

## 2013-09-23 ENCOUNTER — Encounter: Payer: Self-pay | Admitting: Podiatry

## 2013-09-23 ENCOUNTER — Ambulatory Visit (INDEPENDENT_AMBULATORY_CARE_PROVIDER_SITE_OTHER): Payer: Medicare Other | Admitting: Podiatry

## 2013-09-23 VITALS — BP 115/69 | HR 63 | Resp 14

## 2013-09-23 DIAGNOSIS — B351 Tinea unguium: Secondary | ICD-10-CM

## 2013-09-23 DIAGNOSIS — M79609 Pain in unspecified limb: Secondary | ICD-10-CM

## 2013-09-23 NOTE — Progress Notes (Signed)
She presents today with a chief complaint of painful elongated toenails.  Objective: Pulses are palpable bilateral. Mild edema to the right foot no open lesions. Nails are thick yellow dystrophic and mycotic and painful palpation.  Assessment: Pain in limb secondary to onychomycosis 1 through 5 bilateral.  Plan: Debridement of nails in thickness and length as cover service secondary to pain.

## 2013-09-23 NOTE — Patient Instructions (Signed)
Diabetes and Foot Care Diabetes may cause you to have problems because of poor blood supply (circulation) to your feet and legs. This may cause the skin on your feet to become thinner, break easier, and heal more slowly. Your skin may become dry, and the skin may peel and crack. You may also have nerve damage in your legs and feet causing decreased feeling in them. You may not notice minor injuries to your feet that could lead to infections or more serious problems. Taking care of your feet is one of the most important things you can do for yourself.  HOME CARE INSTRUCTIONS  Wear shoes at all times, even in the house. Do not go barefoot. Bare feet are easily injured.  Check your feet daily for blisters, cuts, and redness. If you cannot see the bottom of your feet, use a mirror or ask someone for help.  Wash your feet with warm water (do not use hot water) and mild soap. Then pat your feet and the areas between your toes until they are completely dry. Do not soak your feet as this can dry your skin.  Apply a moisturizing lotion or petroleum jelly (that does not contain alcohol and is unscented) to the skin on your feet and to dry, brittle toenails. Do not apply lotion between your toes.  Trim your toenails straight across. Do not dig under them or around the cuticle. File the edges of your nails with an emery board or nail file.  Do not cut corns or calluses or try to remove them with medicine.  Wear clean socks or stockings every day. Make sure they are not too tight. Do not wear knee-high stockings since they may decrease blood flow to your legs.  Wear shoes that fit properly and have enough cushioning. To break in new shoes, wear them for just a few hours a day. This prevents you from injuring your feet. Always look in your shoes before you put them on to be sure there are no objects inside.  Do not cross your legs. This may decrease the blood flow to your feet.  If you find a minor scrape,  cut, or break in the skin on your feet, keep it and the skin around it clean and dry. These areas may be cleansed with mild soap and water. Do not cleanse the area with peroxide, alcohol, or iodine.  When you remove an adhesive bandage, be sure not to damage the skin around it.  If you have a wound, look at it several times a day to make sure it is healing.  Do not use heating pads or hot water bottles. They may burn your skin. If you have lost feeling in your feet or legs, you may not know it is happening until it is too late.  Make sure your health care Gift Rueckert performs a complete foot exam at least annually or more often if you have foot problems. Report any cuts, sores, or bruises to your health care Everest Brod immediately. SEEK MEDICAL CARE IF:   You have an injury that is not healing.  You have cuts or breaks in the skin.  You have an ingrown nail.  You notice redness on your legs or feet.  You feel burning or tingling in your legs or feet.  You have pain or cramps in your legs and feet.  Your legs or feet are numb.  Your feet always feel cold. SEEK IMMEDIATE MEDICAL CARE IF:   There is increasing redness,   swelling, or pain in or around a wound.  There is a red line that goes up your leg.  Pus is coming from a wound.  You develop a fever or as directed by your health care Shardea Cwynar.  You notice a bad smell coming from an ulcer or wound. Document Released: 03/24/2000 Document Revised: 11/27/2012 Document Reviewed: 09/03/2012 Exit[employer] Patient Information 2014 [employer].  

## 2013-10-01 ENCOUNTER — Telehealth: Payer: Self-pay | Admitting: Family Medicine

## 2013-10-01 NOTE — Telephone Encounter (Signed)
Coralie CommonMichelle Hamilton nurse w/[place] [facility] de[facility]) would like to go see pt 2-3 x wk for help w/ ADL's. Nursing assistance. They would like to ensure that you will sign the orders for pt. orders will be done every 6 mos. If we can call w/ a verbal, she will fax over plan of care after assessment

## 2013-10-01 NOTE — Telephone Encounter (Signed)
Yes.  We will sign any orders.

## 2013-10-01 NOTE — Telephone Encounter (Signed)
Left detailed message on Colleen Collins voicemail.  

## 2013-10-02 ENCOUNTER — Ambulatory Visit: Payer: Medicare Other | Admitting: Family Medicine

## 2013-10-06 ENCOUNTER — Encounter: Payer: Self-pay | Admitting: Family Medicine

## 2013-10-06 ENCOUNTER — Ambulatory Visit (INDEPENDENT_AMBULATORY_CARE_PROVIDER_SITE_OTHER): Payer: Medicare Other | Admitting: Family Medicine

## 2013-10-06 VITALS — BP 124/68 | HR 70 | Wt 115.0 lb

## 2013-10-06 DIAGNOSIS — R609 Edema, unspecified: Secondary | ICD-10-CM

## 2013-10-06 DIAGNOSIS — Z7189 Other specified counseling: Secondary | ICD-10-CM

## 2013-10-06 DIAGNOSIS — R600 Localized edema: Secondary | ICD-10-CM

## 2013-10-06 LAB — BASIC METABOLIC PANEL
BUN: 27 mg/dL — AB (ref 6–23)
CO2: 27 mEq/L (ref 19–32)
CREATININE: 1.1 mg/dL (ref 0.4–1.2)
Calcium: 9.5 mg/dL (ref 8.4–10.5)
Chloride: 103 mEq/L (ref 96–112)
GFR: 52.48 mL/min — AB (ref >60.00)
Glucose, Bld: 179 mg/dL — ABNORMAL HIGH (ref 70–99)
POTASSIUM: 4.2 meq/L (ref 3.5–5.1)
Sodium: 142 mEq/L (ref 135–145)

## 2013-10-06 LAB — HEPATIC FUNCTION PANEL
ALT: 22 U/L (ref 0–35)
AST: 29 U/L (ref 0–37)
Albumin: 4 g/dL (ref 3.5–5.2)
Alkaline Phosphatase: 69 U/L (ref 39–117)
Bilirubin, Direct: 0 mg/dL (ref 0.0–0.3)
Total Bilirubin: 0.4 mg/dL (ref 0.2–1.2)
Total Protein: 7.9 g/dL (ref 6.0–8.3)

## 2013-10-06 LAB — TSH: TSH: 2.4 u[IU]/mL (ref 0.35–4.50)

## 2013-10-06 NOTE — Patient Instructions (Signed)
Elevate legs frequently. Consider compression hose if leg edema persists.

## 2013-10-06 NOTE — Progress Notes (Signed)
Pre visit review using our clinic review tool, if applicable. No additional management support is needed unless otherwise documented below in the visit note. 

## 2013-10-06 NOTE — Progress Notes (Signed)
   Subjective:    Patient ID: Colleen SandhoffLucy Collins, female    DOB: 02/03/1923, [age] y.o.   MRN: [id number]  HPI Patient seen with complaints of bilateral leg swelling. She has advanced dementia and type 2 diabetes along with hypertension. She's had some edema issues in the past. Her weight is actually stable. No dyspnea. Edema is worse late in the day and improved with elevation. Did not use compression garments. She's not on any diuretics. No recent change of medication. No dietary changes.  Renal function has been stable.   family requesting DO NOT RESUSCITATE orders. They ?already have health care power of attorney in place. Patient has advanced dementia  Past Medical History  Diagnosis Date  . Allergy   . GERD (gastroesophageal reflux disease)   . Chronic kidney disease     stones  . Ulcer   . Hyperlipidemia   . Hx of colonic polyps   . Arrhythmia   . Diabetes mellitus   . Dementia    Past Surgical History  Procedure Laterality Date  . Tonsillectomy    . Cataract extraction    . Cholecystectomy      reports that she has never smoked. She has quit using smokeless tobacco. Her smokeless tobacco use included Snuff. She reports that she does not drink alcohol or use illicit drugs. family history includes Diabetes in an other family member; Kidney disease in an other family member; Sudden death in an other family member. No Known Allergies    Review of Systems  Constitutional: Negative for fatigue.  Eyes: Negative for visual disturbance.  Respiratory: Negative for cough, chest tightness, shortness of breath and wheezing.   Cardiovascular: Positive for leg swelling. Negative for chest pain and palpitations.  Neurological: Negative for dizziness, seizures, syncope, weakness, light-headedness and headaches.       Objective:   Physical Exam  Constitutional: She appears well-developed and well-nourished.  Cardiovascular: Normal rate and regular rhythm.   Pulmonary/Chest: Effort normal and  breath sounds normal. No respiratory distress. She has no wheezes. She has no rales.  Musculoskeletal: She exhibits edema.  1+ pitting edema feet and lower legs bilaterally. No ulcers          Assessment & Plan:  bilateral leg edema. Suspect mostly venous stasis. She probably has some diastolic dysfunction at her age. No recent change in medication. Lung exam normal. Recommend frequent elevation. Consider compression garments. Try to avoid diuretics .  Check labs with basic metabolic panel, hepatic panel, TSH  Advanced directives. DO NOT RESUSCITATE order written. Family looking at getting Medical Care Power of [employer] designation.

## 2013-10-11 ENCOUNTER — Inpatient Hospital Stay (HOSPITAL_COMMUNITY)
Admission: EM | Admit: 2013-10-11 | Discharge: 2013-10-20 | DRG: 917 | Disposition: A | Discharge status: Hospice | Payer: Medicare Other | Attending: Internal Medicine | Admitting: Internal Medicine

## 2013-10-11 ENCOUNTER — Encounter (HOSPITAL_COMMUNITY): Payer: Self-pay | Admitting: Emergency Medicine

## 2013-10-11 ENCOUNTER — Emergency Department (HOSPITAL_COMMUNITY): Payer: Medicare Other

## 2013-10-11 DIAGNOSIS — E785 Hyperlipidemia, unspecified: Secondary | ICD-10-CM | POA: Diagnosis present

## 2013-10-11 DIAGNOSIS — E1169 Type 2 diabetes mellitus with other specified complication: Secondary | ICD-10-CM | POA: Diagnosis present

## 2013-10-11 DIAGNOSIS — T424X1S Poisoning by benzodiazepines, accidental (unintentional), sequela: Secondary | ICD-10-CM

## 2013-10-11 DIAGNOSIS — R40243 Glasgow coma scale score 3-8, unspecified time: Secondary | ICD-10-CM

## 2013-10-11 DIAGNOSIS — G92 Toxic encephalopathy: Secondary | ICD-10-CM | POA: Diagnosis not present

## 2013-10-11 DIAGNOSIS — G309 Alzheimer's disease, unspecified: Secondary | ICD-10-CM | POA: Diagnosis present

## 2013-10-11 DIAGNOSIS — Z9849 Cataract extraction status, unspecified eye: Secondary | ICD-10-CM

## 2013-10-11 DIAGNOSIS — Z7189 Other specified counseling: Secondary | ICD-10-CM

## 2013-10-11 DIAGNOSIS — J690 Pneumonitis due to inhalation of food and vomit: Secondary | ICD-10-CM | POA: Diagnosis present

## 2013-10-11 DIAGNOSIS — T424X4A Poisoning by benzodiazepines, undetermined, initial encounter: Principal | ICD-10-CM | POA: Diagnosis present

## 2013-10-11 DIAGNOSIS — J9602 Acute respiratory failure with hypercapnia: Secondary | ICD-10-CM

## 2013-10-11 DIAGNOSIS — F0280 Dementia in other diseases classified elsewhere without behavioral disturbance: Secondary | ICD-10-CM | POA: Diagnosis present

## 2013-10-11 DIAGNOSIS — R9431 Abnormal electrocardiogram [ECG] [EKG]: Secondary | ICD-10-CM

## 2013-10-11 DIAGNOSIS — J9690 Respiratory failure, unspecified, unspecified whether with hypoxia or hypercapnia: Secondary | ICD-10-CM | POA: Diagnosis present

## 2013-10-11 DIAGNOSIS — J9600 Acute respiratory failure, unspecified whether with hypoxia or hypercapnia: Secondary | ICD-10-CM | POA: Diagnosis present

## 2013-10-11 DIAGNOSIS — K219 Gastro-esophageal reflux disease without esophagitis: Secondary | ICD-10-CM | POA: Diagnosis present

## 2013-10-11 DIAGNOSIS — Z66 Do not resuscitate: Secondary | ICD-10-CM | POA: Diagnosis present

## 2013-10-11 DIAGNOSIS — R627 Adult failure to thrive: Secondary | ICD-10-CM | POA: Diagnosis present

## 2013-10-11 DIAGNOSIS — Z515 Encounter for palliative care: Secondary | ICD-10-CM

## 2013-10-11 DIAGNOSIS — G929 Unspecified toxic encephalopathy: Secondary | ICD-10-CM | POA: Diagnosis not present

## 2013-10-11 DIAGNOSIS — R4182 Altered mental status, unspecified: Secondary | ICD-10-CM | POA: Diagnosis present

## 2013-10-11 DIAGNOSIS — R1310 Dysphagia, unspecified: Secondary | ICD-10-CM | POA: Diagnosis present

## 2013-10-11 DIAGNOSIS — E119 Type 2 diabetes mellitus without complications: Secondary | ICD-10-CM | POA: Diagnosis present

## 2013-10-11 DIAGNOSIS — G9340 Encephalopathy, unspecified: Secondary | ICD-10-CM

## 2013-10-11 DIAGNOSIS — N189 Chronic kidney disease, unspecified: Secondary | ICD-10-CM | POA: Diagnosis present

## 2013-10-11 DIAGNOSIS — T424X1A Poisoning by benzodiazepines, accidental (unintentional), initial encounter: Secondary | ICD-10-CM | POA: Diagnosis present

## 2013-10-11 DIAGNOSIS — R4020 Unspecified coma: Secondary | ICD-10-CM | POA: Diagnosis present

## 2013-10-11 LAB — COMPREHENSIVE METABOLIC PANEL
ALT: 20 U/L (ref 0–35)
AST: 32 U/L (ref 0–37)
Albumin: 3.8 g/dL (ref 3.5–5.2)
Alkaline Phosphatase: 77 U/L (ref 39–117)
Anion gap: 13 (ref 5–15)
BUN: 17 mg/dL (ref 6–23)
CO2: 26 meq/L (ref 19–32)
CREATININE: 0.85 mg/dL (ref 0.50–1.10)
Calcium: 9.4 mg/dL (ref 8.4–10.5)
Chloride: 104 mEq/L (ref 96–112)
GFR, EST AFRICAN AMERICAN: 69 mL/min — AB (ref >90)
GFR, EST NON AFRICAN AMERICAN: 59 mL/min — AB (ref >90)
GLUCOSE: 197 mg/dL — AB (ref 70–99)
Potassium: 4.7 mEq/L (ref 3.7–5.3)
SODIUM: 143 meq/L (ref 137–147)
Total Bilirubin: 0.4 mg/dL (ref 0.3–1.2)
Total Protein: 7.9 g/dL (ref 6.0–8.3)

## 2013-10-11 LAB — CBC
HEMATOCRIT: 37.9 % (ref 36.0–46.0)
HEMOGLOBIN: 12.8 g/dL (ref 12.0–15.0)
MCH: 33.4 pg (ref 26.0–34.0)
MCHC: 33.8 g/dL (ref 30.0–36.0)
MCV: 99 fL (ref 78.0–100.0)
Platelets: 215 10*3/uL (ref 150–400)
RBC: 3.83 MIL/uL — ABNORMAL LOW (ref 3.87–5.11)
RDW: 12.8 % (ref 11.5–15.5)
WBC: 5.4 10*3/uL (ref 4.0–10.5)

## 2013-10-11 LAB — ACETAMINOPHEN LEVEL: Acetaminophen (Tylenol), Serum: <15 ug/mL (ref 10–30)

## 2013-10-11 LAB — ETHANOL: Alcohol, Ethyl (B): <11 mg/dL (ref 0–11)

## 2013-10-11 LAB — I-STAT CG4 LACTIC ACID, ED: Lactic Acid, Venous: 0.73 mmol/L (ref 0.5–2.2)

## 2013-10-11 LAB — SALICYLATE LEVEL: Salicylate Lvl: <2 mg/dL — ABNORMAL LOW (ref 2.8–20.0)

## 2013-10-11 LAB — CBG MONITORING, ED: GLUCOSE-CAPILLARY: 200 mg/dL — AB (ref 70–99)

## 2013-10-11 MED ORDER — ETOMIDATE 2 MG/ML IV SOLN
INTRAVENOUS | Status: AC
  Administered 2013-10-12: 15 mg
  Filled 2013-10-11: qty 20

## 2013-10-11 MED ORDER — SODIUM BICARBONATE 8.4 % IV SOLN
50.0000 meq | Freq: Once | INTRAVENOUS | Status: AC
  Administered 2013-10-12: 50 meq via INTRAVENOUS
  Filled 2013-10-11: qty 50

## 2013-10-11 MED ORDER — ROCURONIUM BROMIDE 50 MG/5ML IV SOLN
INTRAVENOUS | Status: AC
  Filled 2013-10-11: qty 2

## 2013-10-11 MED ORDER — SUCCINYLCHOLINE CHLORIDE 20 MG/ML IJ SOLN
INTRAMUSCULAR | Status: AC
  Administered 2013-10-12: 50 mg
  Filled 2013-10-11: qty 1

## 2013-10-11 MED ORDER — LIDOCAINE HCL (CARDIAC) 20 MG/ML IV SOLN
INTRAVENOUS | Status: AC
  Filled 2013-10-11: qty 5

## 2013-10-11 NOTE — ED Notes (Signed)
Attempted in/out cath unsuccessfully.  Bair Hugger placed for rectal temp 96.0.

## 2013-10-11 NOTE — ED Notes (Signed)
Husband states that patient took 50 tablets of clonazepam 5mg , not midazolam.

## 2013-10-11 NOTE — ED Notes (Signed)
Attempted an in and out cath x2 but unsuccessful.

## 2013-10-11 NOTE — ED Notes (Signed)
CG-4 reported to Dr. Romeo AppleHarrison

## 2013-10-11 NOTE — ED Notes (Signed)
Per EMS: Pt from home, husband reports that wife has been unresponsive since 0930 this AM. States wife was fine at 0530 this morning but then found her on couch with bottle of Midazolam. States that 50 pills were missing from the bottle. On EMS arrival, CBG 45, given amp D50 increasing to 150. Pt responding to painful stimuli. 130 pap. 80 reg. 16 shallow, nasal trumpet in place in left nare. Hx: dementia

## 2013-10-12 ENCOUNTER — Encounter (HOSPITAL_COMMUNITY): Payer: Self-pay | Admitting: Internal Medicine

## 2013-10-12 ENCOUNTER — Emergency Department (HOSPITAL_COMMUNITY): Payer: Medicare Other

## 2013-10-12 ENCOUNTER — Inpatient Hospital Stay (HOSPITAL_COMMUNITY): Payer: Medicare Other

## 2013-10-12 DIAGNOSIS — R4182 Altered mental status, unspecified: Secondary | ICD-10-CM | POA: Diagnosis present

## 2013-10-12 DIAGNOSIS — R9431 Abnormal electrocardiogram [ECG] [EKG]: Secondary | ICD-10-CM

## 2013-10-12 DIAGNOSIS — J9690 Respiratory failure, unspecified, unspecified whether with hypoxia or hypercapnia: Secondary | ICD-10-CM | POA: Diagnosis present

## 2013-10-12 DIAGNOSIS — G9340 Encephalopathy, unspecified: Secondary | ICD-10-CM

## 2013-10-12 DIAGNOSIS — E119 Type 2 diabetes mellitus without complications: Secondary | ICD-10-CM | POA: Diagnosis present

## 2013-10-12 DIAGNOSIS — T424X1A Poisoning by benzodiazepines, accidental (unintentional), initial encounter: Secondary | ICD-10-CM

## 2013-10-12 DIAGNOSIS — T424X4A Poisoning by benzodiazepines, undetermined, initial encounter: Secondary | ICD-10-CM

## 2013-10-12 LAB — URINALYSIS, ROUTINE W REFLEX MICROSCOPIC
BILIRUBIN URINE: NEGATIVE
GLUCOSE, UA: NEGATIVE mg/dL
KETONES UR: NEGATIVE mg/dL
LEUKOCYTES UA: NEGATIVE
Nitrite: NEGATIVE
PROTEIN: NEGATIVE mg/dL
Specific Gravity, Urine: 1.012 (ref 1.005–1.030)
Urobilinogen, UA: 0.2 mg/dL (ref 0.0–1.0)
pH: 7 (ref 5.0–8.0)

## 2013-10-12 LAB — I-STAT ARTERIAL BLOOD GAS, ED
Acid-Base Excess: 5 mmol/L — ABNORMAL HIGH (ref 0.0–2.0)
BICARBONATE: 31.3 meq/L — AB (ref 20.0–24.0)
O2 Saturation: 100 %
PH ART: 7.412 (ref 7.350–7.450)
PO2 ART: 489 mmHg — AB (ref 80.0–100.0)
Patient temperature: 36
TCO2: 33 mmol/L (ref 0–100)
pCO2 arterial: 48.7 mmHg — ABNORMAL HIGH (ref 35.0–45.0)

## 2013-10-12 LAB — CBC
HCT: 40.9 % (ref 36.0–46.0)
Hemoglobin: 13.6 g/dL (ref 12.0–15.0)
MCH: 33.6 pg (ref 26.0–34.0)
MCHC: 33.3 g/dL (ref 30.0–36.0)
MCV: 101 fL — ABNORMAL HIGH (ref 78.0–100.0)
PLATELETS: 201 10*3/uL (ref 150–400)
RBC: 4.05 MIL/uL (ref 3.87–5.11)
RDW: 12.8 % (ref 11.5–15.5)
WBC: 8.7 10*3/uL (ref 4.0–10.5)

## 2013-10-12 LAB — MRSA PCR SCREENING: MRSA BY PCR: NEGATIVE

## 2013-10-12 LAB — GLUCOSE, CAPILLARY
GLUCOSE-CAPILLARY: 138 mg/dL — AB (ref 70–99)
GLUCOSE-CAPILLARY: 167 mg/dL — AB (ref 70–99)
Glucose-Capillary: 124 mg/dL — ABNORMAL HIGH (ref 70–99)
Glucose-Capillary: 122 mg/dL — ABNORMAL HIGH (ref 70–99)
Glucose-Capillary: 152 mg/dL — ABNORMAL HIGH (ref 70–99)

## 2013-10-12 LAB — BASIC METABOLIC PANEL
ANION GAP: 14 (ref 5–15)
BUN: 15 mg/dL (ref 6–23)
CALCIUM: 8.9 mg/dL (ref 8.4–10.5)
CO2: 22 mEq/L (ref 19–32)
CREATININE: 0.74 mg/dL (ref 0.50–1.10)
Chloride: 106 mEq/L (ref 96–112)
GFR, EST AFRICAN AMERICAN: 85 mL/min — AB (ref >90)
GFR, EST NON AFRICAN AMERICAN: 74 mL/min — AB (ref >90)
Glucose, Bld: 133 mg/dL — ABNORMAL HIGH (ref 70–99)
Potassium: 4.4 mEq/L (ref 3.7–5.3)
SODIUM: 142 meq/L (ref 137–147)

## 2013-10-12 LAB — RAPID URINE DRUG SCREEN, HOSP PERFORMED
Amphetamines: NOT DETECTED
BARBITURATES: NOT DETECTED
BENZODIAZEPINES: POSITIVE — AB
Cocaine: NOT DETECTED
Opiates: NOT DETECTED
Tetrahydrocannabinol: NOT DETECTED

## 2013-10-12 LAB — CBG MONITORING, ED: Glucose-Capillary: 153 mg/dL — ABNORMAL HIGH (ref 70–99)

## 2013-10-12 LAB — URINE MICROSCOPIC-ADD ON

## 2013-10-12 LAB — PHOSPHORUS: Phosphorus: 3.2 mg/dL (ref 2.3–4.6)

## 2013-10-12 LAB — MAGNESIUM
Magnesium: 1.9 mg/dL (ref 1.5–2.5)
Magnesium: 1.6 mg/dL (ref 1.5–2.5)

## 2013-10-12 LAB — CK: Total CK: 44 U/L (ref 7–177)

## 2013-10-12 MED ORDER — SODIUM CHLORIDE 0.9 % IV SOLN
INTRAVENOUS | Status: DC
  Administered 2013-10-12: 17:00:00 via INTRAVENOUS

## 2013-10-12 MED ORDER — ETOMIDATE 2 MG/ML IV SOLN
10.0000 mg | Freq: Once | INTRAVENOUS | Status: AC
Start: 2013-10-12 — End: 2013-10-12
  Administered 2013-10-12: 10 mg via INTRAVENOUS

## 2013-10-12 MED ORDER — PROPOFOL 10 MG/ML IV EMUL
5.0000 ug/kg/min | Freq: Once | INTRAVENOUS | Status: AC
  Administered 2013-10-12: 20 ug/kg/min via INTRAVENOUS

## 2013-10-12 MED ORDER — PANTOPRAZOLE SODIUM 40 MG IV SOLR
40.0000 mg | INTRAVENOUS | Status: DC
  Administered 2013-10-12 – 2013-10-13 (×2): 40 mg via INTRAVENOUS
  Filled 2013-10-12 (×3): qty 40

## 2013-10-12 MED ORDER — PROPOFOL 10 MG/ML IV EMUL
INTRAVENOUS | Status: AC
  Filled 2013-10-12: qty 100

## 2013-10-12 MED ORDER — VITAL HIGH PROTEIN PO LIQD
1000.0000 mL | ORAL | Status: DC
  Administered 2013-10-12: 1000 mL
  Filled 2013-10-12 (×3): qty 1000

## 2013-10-12 MED ORDER — SODIUM CHLORIDE 0.9 % IV SOLN: 250.0000 mL | INTRAVENOUS | Status: DC | PRN

## 2013-10-12 MED ORDER — INSULIN ASPART 100 UNIT/ML ~~LOC~~ SOLN
1.0000 [IU] | SUBCUTANEOUS | Status: DC
  Administered 2013-10-12 (×2): 2 [IU] via SUBCUTANEOUS
  Administered 2013-10-12 – 2013-10-13 (×3): 1 [IU] via SUBCUTANEOUS
  Administered 2013-10-13: 2 [IU] via SUBCUTANEOUS

## 2013-10-12 MED ORDER — CHLORHEXIDINE GLUCONATE 0.12 % MT SOLN
15.0000 mL | Freq: Two times a day (BID) | OROMUCOSAL | Status: DC
  Administered 2013-10-12 (×2): 15 mL via OROMUCOSAL
  Filled 2013-10-12: qty 15

## 2013-10-12 MED ORDER — HEPARIN SODIUM (PORCINE) 5000 UNIT/ML IJ SOLN
5000.0000 [IU] | Freq: Three times a day (TID) | INTRAMUSCULAR | Status: DC
  Administered 2013-10-12 – 2013-10-14 (×7): 5000 [IU] via SUBCUTANEOUS
  Filled 2013-10-12 (×10): qty 1

## 2013-10-12 NOTE — ED Provider Notes (Signed)
CSN: [id number]     Arrival date & time 10/11/13  2222 History   First MD Initiated Contact with Patient 10/11/13 2309     Chief Complaint  Patient presents with  . Drug Overdose  . Hypoglycemia     (Consider location/radiation/quality/duration/timing/severity/associated sxs/prior Treatment) HPI Comments: LEVEL 5 CAVEAT FOR ALTERED MENTAL STATUS.  Pt's husband brings patient to the ER because she is unresponsive. Pt has dementia and DM. Husband alleges that patient was waking up in the middle of the night multiple times, and so he gave her a klonipin tablet at 5:30, and put her to bed. He woke up at 9:30 to find patient with the klonipin bottle and 50 tablets missing, and laying on the couch, not responding. Husband waited for her to wake up, but she was not, so he called EMS eventually.  Pt arrives with GCS of 3 (e/v/m = 1/1/1), no gag, and wincing to noxious stimuli. EMS also noted her CBG to be 45 when they arrived, and she was given an amp of d50.  The history is provided by the spouse.    Past Medical History  Diagnosis Date  . Allergy   . GERD (gastroesophageal reflux disease)   . Chronic kidney disease     stones  . Ulcer   . Hyperlipidemia   . Hx of colonic polyps   . Arrhythmia   . Diabetes mellitus   . Dementia    Past Surgical History  Procedure Laterality Date  . Tonsillectomy    . Cataract extraction    . Cholecystectomy     Family History  Problem Relation Age of Onset  . Diabetes      fhx  . Kidney disease      fhx  . Sudden death      fhx   History  Substance Use Topics  . Smoking status: Never Smoker   . Smokeless tobacco: Former NeurosurgeonUser    Types: Snuff  . Alcohol Use: No   OB History   Grav Para Term Preterm Abortions TAB SAB Ect Mult Living                 Review of Systems  Unable to perform ROS: Patient unresponsive      Allergies  Review of patient's allergies indicates no known allergies.  Home Medications   Prior to Admission  medications   Medication Sig Start Date End Date Taking? Authorizing Provider  glyBURIDE (DIABETA) 5 MG tablet Take 5 mg by mouth 2 (two) times daily with a meal.   Yes Historical Provider, MD  LORazepam (ATIVAN) 0.5 MG tablet Take 1 tablet (0.5 mg total) by mouth at bedtime. 09/12/13  Yes Kristian CoveyBruce W Burchette, MD  metFORMIN (GLUCOPHAGE) 500 MG tablet Take 500 mg by mouth 2 (two) times daily with a meal.   Yes Historical Provider, MD  mirtazapine (REMERON) 7.5 MG tablet Take 1 tablet (7.5 mg total) by mouth at bedtime. 02/12/13  Yes Kristian CoveyBruce W Burchette, MD  simvastatin (ZOCOR) 40 MG tablet Take 1 tablet by mouth at  bedtime 06/09/13  Yes Kristian CoveyBruce W Burchette, MD  Vitamin D, Ergocalciferol, (DRISDOL) 50000 UNITS CAPS capsule Take 50,000 Units by mouth every 7 (seven) days. On sundays   Yes Historical Provider, MD   BP 175/100  Pulse 79  Temp(Src) 96 F (35.6 C) (Rectal)  Resp 16  Ht 5' 1.81" (1.57 m)  Wt 115 lb (52.164 kg)  BMI 21.16 kg/m2  SpO2 100% Physical Exam  Nursing note  and vitals reviewed. Constitutional: She appears well-nourished.  HENT:  Head: Atraumatic.  Eyes: Conjunctivae are normal.  2 mm and equal  Neck: Neck supple.  Cardiovascular: Normal rate.   Pulmonary/Chest: Effort normal.  Shallow respiration w/ rhonchi and moderate secretion on suctioning.  Abdominal: Soft.  Neurological:  gcs = 3  Skin: Skin is warm.    ED Course  OG placement Date/Time: 10/12/2013 12:43 AM Performed by: Derwood KaplanNANAVATI, Ehab Humber Authorized by: Derwood KaplanNANAVATI, Fadumo Heng Consent: The procedure was performed in an emergent situation. Patient identity confirmed: arm band Time out: Immediately prior to procedure a "time out" was called to verify the correct patient, procedure, equipment, support staff and site/side marked as required. Local anesthesia used: no Patient tolerance: Patient tolerated the procedure well with no immediate complications.   (including critical care time) Labs Review Labs Reviewed  CBC -  Abnormal; Notable for the following:    RBC 3.83 (*)    All other components within normal limits  COMPREHENSIVE METABOLIC PANEL - Abnormal; Notable for the following:    Glucose, Bld 197 (*)    GFR calc non Af Amer 59 (*)    GFR calc Af Amer 69 (*)    All other components within normal limits  SALICYLATE LEVEL - Abnormal; Notable for the following:    Salicylate Lvl <2.0 (*)    All other components within normal limits  CBG MONITORING, ED - Abnormal; Notable for the following:    Glucose-Capillary 200 (*)    All other components within normal limits  CBG MONITORING, ED - Abnormal; Notable for the following:    Glucose-Capillary 153 (*)    All other components within normal limits  ETHANOL  ACETAMINOPHEN LEVEL  MAGNESIUM  URINE RAPID DRUG SCREEN (HOSP PERFORMED)  URINALYSIS, ROUTINE W REFLEX MICROSCOPIC  BLOOD GAS, ARTERIAL  I-STAT CG4 LACTIC ACID, ED    Imaging Review Dg Chest Portable 1 View  10/11/2013   CLINICAL DATA:  Hyperglycemia.  Drug overdose.  EXAM: PORTABLE CHEST - 1 VIEW  COMPARISON:  07/01/2011  FINDINGS: Borderline cardiomegaly. Negative upper mediastinal contours. Mild hypoaeration with minimal left basilar atelectasis. There is no edema, consolidation, effusion, or pneumothorax. Cholecystectomy changes.  IMPRESSION: No active disease.   Electronically Signed   By: Tiburcio PeaJonathan  Watts M.D.   On: 10/11/2013 23:31     EKG Interpretation   Date/Time:  Saturday October 11 2013 22:32:22 EDT Ventricular Rate:  78 PR Interval:  112 QRS Duration: 137 QT Interval:  564 QTC Calculation: 643 R Axis:   -48 Text Interpretation:  Sinus or ectopic atrial rhythm Borderline short PR  interval Nonspecific IVCD with LAD Inferior infarct, old Abnormal lateral  Q waves Anterior infarct, old Confirmed by Rhunette CroftNANAVATI, MD, Janey GentaANKIT [phone]([id number]) on  10/11/2013 11:26:44 PM      MDM   Final diagnoses:  Clonazepam poisoning, accidental or unintentional, initial encounter  Glasgow coma scale total  score 3-8    INTUBATION Performed by: Junius FinnerNanavati, Arhianna Ebey, Stacy Shepard, Resident Physician  Required items: required blood products, implants, devices, and special equipment available Patient identity confirmed: provided demographic data and hospital-assigned identification number Time out: Immediately prior to procedure a "time out" was called to verify the correct patient, procedure, equipment, support staff and site/side marked as required.  Indications: airway protection  Intubation method: Glidescope Laryngoscopy   Preoxygenation: BVM  Sedative  15 mg Etomidate, and additional 10 mg Paralytic: 50 mg sucinylcholine  Tube Size: 7cuffed  Post-procedure assessment: chest rise and ETCO2 monitor Breath sounds: equal  and absent over the epigastrium Tube secured with: ETT holder Chest x-ray interpreted by radiologist and me.  Chest x-ray findings: endotracheal tube in appropriate position  Patient tolerated the procedure well with no immediate complications.   CRITICAL CARE Performed by: Derwood Kaplan   Total critical care time: 45 minutes  Critical care time was exclusive of separately billable procedures and treating other patients.  Critical care was necessary to treat or prevent imminent or life-threatening deterioration.  Critical care was time spent personally by me on the following activities: development of treatment plan with patient and/or surrogate as well as nursing, discussions with consultants, evaluation of patient's response to treatment, examination of patient, obtaining history from patient or surrogate, ordering and performing treatments and interventions, ordering and review of laboratory studies, ordering and review of radiographic studies, pulse oximetry and re-evaluation of patient's condition.   Pt comes in comatose with klonipin OD. Seems like the OD occurred between 5:30-9:30. She has a GCS of 3 still, last normal seen at 5:30 am. Accidental  overdose due to her dementia. Intubated for airway protection. Hypoglycemia has responded to the d50. CT head ordered. CCM admitting.   Derwood Kaplan, MD 10/12/13 [phone]

## 2013-10-12 NOTE — Progress Notes (Signed)
PULMONARY / CRITICAL CARE MEDICINE HISTORY AND PHYSICAL EXAMINATION   Name: Colleen Collins MRN: [id number] DOB: Aug 21, 1922    ADMISSION DATE:  10/11/2013  PRIMARY SERVICE: PCCM  CHIEF COMPLAINT:  AMS  BRIEF PATIENT DESCRIPTION: [age] F with dementia who accidentally overdosed on ~ 40 x 0.5 mg tabs. Intubated for respiratory distress.   SIGNIFICANT EVENTS / STUDIES:  Intubated 7/5 7/5 Head CT > NAICP, atrophy noted  LINES / TUBES: PIVs Foley 7/5  CULTURES: None  ANTIBIOTICS: None   SUBJECTIVE: slow to arouse on WUA this AM, no acute events  VITAL SIGNS: Temp:  [96 F (35.6 C)-97.9 F (36.6 C)] 97.8 F (36.6 C) (07/05 0400) Pulse Rate:  [71-116] 89 (07/05 0615) Resp:  [14-21] 20 (07/05 0615) BP: (121-184)/(59-100) 124/72 mmHg (07/05 0615) SpO2:  [61 %-100 %] 100 % (07/05 0615) FiO2 (%):  [40 %-100 %] 40 % (07/05 0851) Weight:  [52.164 kg (115 lb)-52.2 kg (115 lb 1.3 oz)] 52.2 kg (115 lb 1.3 oz) (07/05 0400) HEMODYNAMICS:   VENTILATOR SETTINGS: Vent Mode:  [-] PRVC FiO2 (%):  [40 %-100 %] 40 % Set Rate:  [18 bmp] 18 bmp Vt Set:  [380 mL] 380 mL PEEP:  [5 cmH20] 5 cmH20 Plateau Pressure:  [15 cmH20] 15 cmH20 INTAKE / OUTPUT: Intake/Output     07/04 0701 - 07/05 0700 07/05 0701 - 07/06 0700   Urine (mL/kg/hr) 420    Total Output 420     Net -420            PHYSICAL EXAMINATION: General:  Sedated on vent HEENT: NCAT, ETT PULM: CTA B CV: RRR, no mgr AB: BS+, soft, nontender Ext: warm, no edema Neuro: requires sternal rub to arouse  LABS:  CBC  Recent Labs Lab 10/11/13 2248 10/12/13 0453  WBC 5.4 8.7  HGB 12.8 13.6  HCT 37.9 40.9  PLT 215 201   Coag's No results found for this basename: APTT, INR,  in the last 168 hours BMET  Recent Labs Lab 10/06/13 1525 10/11/13 2248 10/12/13 0453  NA 142 143 142  K 4.2 4.7 4.4  CL 103 104 106  CO2 27 26 22   BUN 27* 17 15  CREATININE 1.1 0.85 0.74  GLUCOSE 179* 197* 133*   Electrolytes  Recent  Labs Lab 10/06/13 1525 10/11/13 2248 10/12/13 0453  CALCIUM 9.5 9.4 8.9  MG  --  1.9 1.6  PHOS  --   --  3.2   Sepsis Markers  Recent Labs Lab 10/11/13 2251  LATICACIDVEN 0.73   ABG  Recent Labs Lab 10/12/13 0217  PHART 7.412  PCO2ART 48.7*  PO2ART 489.0*   Liver Enzymes  Recent Labs Lab 10/06/13 1525 10/11/13 2248  AST 29 32  ALT 22 20  ALKPHOS 69 77  BILITOT 0.4 0.4  ALBUMIN 4.0 3.8   Cardiac Enzymes No results found for this basename: TROPONINI, PROBNP,  in the last 168 hours Glucose  Recent Labs Lab 10/11/13 2240 10/12/13 0024 10/12/13 0401 10/12/13 0729  GLUCAP 200* 153* 124* 122*    Imaging   EKG: Prolonged QT interval. CXR: ETT in place, R hilar fullness  ASSESSMENT / PLAN:  Active Problems:   Altered mental status   Respiratory failure   Benzodiazepine (tranquilizer) overdose   Diabetes  NEUROLOGIC A: Acute encephalopathy due to benzodiazepine overdose P:   Maintain off sedation  PULMONARY A: Acute Respiratory Failure: due to clonazepam overdose Abnormal CXR: R hilar fullness. P:   SBT when more awake VAP  prevention Consider CT chest post extubation  CARDIOVASCULAR A: No acute issue  RENAL A: No acute issue  GASTROINTESTINAL A: No acute issue  HEMATOLOGIC A: No acute issue  INFECTIOUS A: No acute issue  ENDOCRINE A: DM:  P:   SSI  TODAY'S SUMMARY:   Additional CC time by me 50 minutes  Heber CarolinaBrent Aiya Keach, MD [place] [facility] Pager: [phone] Cell: [phone] If no response, call [phone]    10/12/2013, 11:26 AM

## 2013-10-12 NOTE — H&P (Signed)
PULMONARY / CRITICAL CARE MEDICINE HISTORY AND PHYSICAL EXAMINATION   Name: Colleen SandhoffLucy Collins MRN: [id number] DOB: 29-Nov-1922    ADMISSION DATE:  10/11/2013  PRIMARY SERVICE: PCCM  CHIEF COMPLAINT:  AMS  BRIEF PATIENT DESCRIPTION: [age] F with dementia who accidentally overdosed on ~ 40 x 0.5 mg tabs. Intubated for respiratory distress.   SIGNIFICANT EVENTS / STUDIES:  Intubated 7/5  LINES / TUBES: PIVs Foley 7/5  CULTURES: None  ANTIBIOTICS: None  HISTORY OF PRESENT ILLNESS:  Ms. Colleen SeltzerBaker is an 80 yo F with dementia [facility] after being somnolent all day 7/4. Per her husband, he left a bottle of Klonipin out and the patient accessed in at ~ 2am on 7/4. She fell asleep on their couch and her husband watched her throughout the day on 7/4. He called EMS because she did not wake up and he was concerned about her ability to control her secretions. She is demented at baseline but has not been acting differently the last few days. She has not complained of anything either.   PAST MEDICAL HISTORY :  Past Medical History  Diagnosis Date  . Allergy   . GERD (gastroesophageal reflux disease)   . Chronic kidney disease     stones  . Ulcer   . Hyperlipidemia   . Hx of colonic polyps   . Arrhythmia   . Diabetes mellitus   . Dementia    Past Surgical History  Procedure Laterality Date  . Tonsillectomy    . Cataract extraction    . Cholecystectomy     Prior to Admission medications   Medication Sig Start Date End Date Taking? Authorizing Provider  glyBURIDE (DIABETA) 5 MG tablet Take 5 mg by mouth 2 (two) times daily with a meal.   Yes Historical Provider, MD  LORazepam (ATIVAN) 0.5 MG tablet Take 1 tablet (0.5 mg total) by mouth at bedtime. 09/12/13  Yes Kristian CoveyBruce W Burchette, MD  metFORMIN (GLUCOPHAGE) 500 MG tablet Take 500 mg by mouth 2 (two) times daily with a meal.   Yes Historical Provider, MD  mirtazapine (REMERON) 7.5 MG tablet Take 1 tablet (7.5 mg total) by mouth at  bedtime. 02/12/13  Yes Kristian CoveyBruce W Burchette, MD  simvastatin (ZOCOR) 40 MG tablet Take 1 tablet by mouth at  bedtime 06/09/13  Yes Kristian CoveyBruce W Burchette, MD  Vitamin D, Ergocalciferol, (DRISDOL) 50000 UNITS CAPS capsule Take 50,000 Units by mouth every 7 (seven) days. On sundays   Yes Historical Provider, MD   No Known Allergies  FAMILY HISTORY:  Family History  Problem Relation Age of Onset  . Diabetes      fhx  . Kidney disease      fhx  . Sudden death      fhx   SOCIAL HISTORY:  reports that she has never smoked. She has quit using smokeless tobacco. Her smokeless tobacco use included Snuff. She reports that she does not drink alcohol or use illicit drugs.  REVIEW OF SYSTEMS:  Unable to obtain secondary to patient condition.   SUBJECTIVE:   VITAL SIGNS: Temp:  [96 F (35.6 C)] 96 F (35.6 C) (07/04 2257) Pulse Rate:  [71-116] 73 (07/05 0150) Resp:  [14-20] 17 (07/05 0150) BP: (127-184)/(64-100) 147/64 mmHg (07/05 0145) SpO2:  [61 %-100 %] 100 % (07/05 0150) Weight:  [115 lb (52.164 kg)] 115 lb (52.164 kg) (07/04 2257) HEMODYNAMICS:   VENTILATOR SETTINGS:   INTAKE / OUTPUT: Intake/Output   None     PHYSICAL  EXAMINATION: General:  Elderly F in NAD Neuro:  No response to painful stimuli HEENT:  Sclera anicteric, conjunctiva pink, MMM, OP Clear, ETT present Neck: Trachea supple and midline, (-) LAN or JVD Cardiovascular:  RRR, NS1/S2, (-) MRG Lungs:  Mechanical BS bilaterally Abdomen:  S/NT/ND/(+)BS Musculoskeletal:  (-) C/C/E Skin:  Intact  LABS:  CBC  Recent Labs Lab 10/11/13 2248  WBC 5.4  HGB 12.8  HCT 37.9  PLT 215   Coag's No results found for this basename: APTT, INR,  in the last 168 hours BMET  Recent Labs Lab 10/06/13 1525 10/11/13 2248  NA 142 143  K 4.2 4.7  CL 103 104  CO2 27 26  BUN 27* 17  CREATININE 1.1 0.85  GLUCOSE 179* 197*   Electrolytes  Recent Labs Lab 10/06/13 1525 10/11/13 2248  CALCIUM 9.5 9.4  MG  --  1.9    Sepsis Markers  Recent Labs Lab 10/11/13 2251  LATICACIDVEN 0.73   ABG No results found for this basename: PHART, PCO2ART, PO2ART,  in the last 168 hours Liver Enzymes  Recent Labs Lab 10/06/13 1525 10/11/13 2248  AST 29 32  ALT 22 20  ALKPHOS 69 77  BILITOT 0.4 0.4  ALBUMIN 4.0 3.8   Cardiac Enzymes No results found for this basename: TROPONINI, PROBNP,  in the last 168 hours Glucose  Recent Labs Lab 10/11/13 2240 10/12/13 0024  GLUCAP 200* 153*    Imaging Dg Chest Portable 1 View  10/12/2013   CLINICAL DATA:  Endotracheal tube placed  EXAM: PORTABLE CHEST - 1 VIEW  COMPARISON:  Chest x-ray from yesterday  FINDINGS: New endotracheal tube, tip at the level of the mid thoracic trachea. A new orogastric tube enters the stomach at least.  Normal heart size and mediastinal contours. The lungs remain clear. No effusion or pneumothorax. Remote appearing lateral right clavicle fracture.  IMPRESSION: 1. New endotracheal and orogastric tubes are in good position. 2. Lungs remain clear.   Electronically Signed   By: Tiburcio PeaJonathan  Watts M.D.   On: 10/12/2013 00:56   Dg Chest Portable 1 View  10/11/2013   CLINICAL DATA:  Hyperglycemia.  Drug overdose.  EXAM: PORTABLE CHEST - 1 VIEW  COMPARISON:  07/01/2011  FINDINGS: Borderline cardiomegaly. Negative upper mediastinal contours. Mild hypoaeration with minimal left basilar atelectasis. There is no edema, consolidation, effusion, or pneumothorax. Cholecystectomy changes.  IMPRESSION: No active disease.   Electronically Signed   By: Tiburcio PeaJonathan  Watts M.D.   On: 10/11/2013 23:31    EKG: Prolonged QT interval. CXR: Personally reviewed. Prominence of the right hila unchanged since 06/2011.   ASSESSMENT / PLAN:  Active Problems:   Altered mental status   Respiratory failure   Benzodiazepine (tranquilizer) overdose   Diabetes   PULMONARY A: Respiratory Failure: 2/2 AMS. No evidence of impaired gas exchange. P:   ABG Lung Protective  ventilation VAP prevention SBT  CARDIOVASCULAR A: No acute issue  RENAL A: No acute issue  GASTROINTESTINAL A: No acute issue  HEMATOLOGIC A: No acute issue  INFECTIOUS A: No acute issue  ENDOCRINE A: DM:  P:   SSI  NEUROLOGIC A: AMS 2/2 BZD OD: Hesitant to use flumazenil given patient's use of ativan at home and the risk of precipitating a seizure. In addition, given long acting nature of Clonazepam she would likely require a drip. As such, I think waiting for it to be metabolized is reasonable. P:   Maintain off sedation  BEST PRACTICE / DISPOSITION Level of  Care:  ICU Primary Service:  PCCM Consultants:  None Code Status:  DNR per last PCP note Diet:  Tube feeds DVT Px:  SQH GI Px:  PPI Skin Integrity:  Intact Social / Family:  Husband updated at bedside  TODAY'S SUMMARY:   I have personally obtained a history, examined the patient, evaluated laboratory and imaging results, formulated the assessment and plan and placed orders.  CRITICAL CARE: The patient is critically ill with multiple organ systems failure and requires high complexity decision making for assessment and support, frequent evaluation and titration of therapies, application of advanced monitoring technologies and extensive interpretation of multiple databases. Critical Care Time devoted to patient care services described in this note is 30 minutes.   Evalyn Casco, MD Pulmonary and Critical Care Medicine [facility] Pager: [phone]   10/12/2013, 2:10 AM

## 2013-10-12 NOTE — ED Notes (Signed)
Pt intubated with size 7 at 22cm at lip

## 2013-10-12 NOTE — Progress Notes (Signed)
Pt transported to 2M15 with no complications noted.

## 2013-10-12 NOTE — Progress Notes (Signed)
Patient transported to and from CT without incident on 100% Fi02.

## 2013-10-12 NOTE — ED Notes (Signed)
Pt transferred from D 34 to Trauma B for intubation secondary to bilateral crackles and pt being obtunded from klonopin overdose.

## 2013-10-12 NOTE — ED Notes (Signed)
Attempted to call report

## 2013-10-13 DIAGNOSIS — J9600 Acute respiratory failure, unspecified whether with hypoxia or hypercapnia: Secondary | ICD-10-CM

## 2013-10-13 DIAGNOSIS — G9340 Encephalopathy, unspecified: Secondary | ICD-10-CM

## 2013-10-13 DIAGNOSIS — T424X1A Poisoning by benzodiazepines, accidental (unintentional), initial encounter: Secondary | ICD-10-CM

## 2013-10-13 DIAGNOSIS — T424X4A Poisoning by benzodiazepines, undetermined, initial encounter: Principal | ICD-10-CM

## 2013-10-13 LAB — BASIC METABOLIC PANEL
ANION GAP: 11 (ref 5–15)
BUN: 17 mg/dL (ref 6–23)
CALCIUM: 8.7 mg/dL (ref 8.4–10.5)
CO2: 24 mEq/L (ref 19–32)
Chloride: 107 mEq/L (ref 96–112)
Creatinine, Ser: 0.78 mg/dL (ref 0.50–1.10)
GFR calc non Af Amer: 73 mL/min — ABNORMAL LOW (ref >90)
GFR, EST AFRICAN AMERICAN: 84 mL/min — AB (ref >90)
Glucose, Bld: 165 mg/dL — ABNORMAL HIGH (ref 70–99)
Potassium: 3.4 mEq/L — ABNORMAL LOW (ref 3.7–5.3)
SODIUM: 142 meq/L (ref 137–147)

## 2013-10-13 LAB — GLUCOSE, CAPILLARY
GLUCOSE-CAPILLARY: 125 mg/dL — AB (ref 70–99)
GLUCOSE-CAPILLARY: 166 mg/dL — AB (ref 70–99)
GLUCOSE-CAPILLARY: 204 mg/dL — AB (ref 70–99)
GLUCOSE-CAPILLARY: 235 mg/dL — AB (ref 70–99)
Glucose-Capillary: 122 mg/dL — ABNORMAL HIGH (ref 70–99)
Glucose-Capillary: 232 mg/dL — ABNORMAL HIGH (ref 70–99)

## 2013-10-13 MED ORDER — FREE WATER
200.0000 mL | Freq: Three times a day (TID) | Status: DC
  Administered 2013-10-13 – 2013-10-14 (×3): 200 mL

## 2013-10-13 MED ORDER — POTASSIUM CHLORIDE 20 MEQ/15ML (10%) PO LIQD
ORAL | Status: AC
  Filled 2013-10-13: qty 30

## 2013-10-13 MED ORDER — CHLORHEXIDINE GLUCONATE 0.12 % MT SOLN
15.0000 mL | Freq: Two times a day (BID) | OROMUCOSAL | Status: DC
  Administered 2013-10-13 – 2013-10-20 (×13): 15 mL via OROMUCOSAL
  Filled 2013-10-13 (×18): qty 15

## 2013-10-13 MED ORDER — VITAL AF 1.2 CAL PO LIQD
1000.0000 mL | ORAL | Status: DC
  Administered 2013-10-13: 1000 mL
  Filled 2013-10-13 (×3): qty 1000

## 2013-10-13 MED ORDER — METFORMIN HCL 500 MG PO TABS
500.0000 mg | ORAL_TABLET | Freq: Every day | ORAL | Status: DC
  Administered 2013-10-14: 500 mg via ORAL
  Filled 2013-10-13 (×3): qty 1

## 2013-10-13 MED ORDER — BIOTENE DRY MOUTH MT LIQD
15.0000 mL | Freq: Four times a day (QID) | OROMUCOSAL | Status: DC
  Administered 2013-10-13 – 2013-10-20 (×24): 15 mL via OROMUCOSAL

## 2013-10-13 MED ORDER — INSULIN ASPART 100 UNIT/ML ~~LOC~~ SOLN
0.0000 [IU] | SUBCUTANEOUS | Status: DC
  Administered 2013-10-13 (×3): 3 [IU] via SUBCUTANEOUS
  Administered 2013-10-14: 2 [IU] via SUBCUTANEOUS
  Administered 2013-10-14: 3 [IU] via SUBCUTANEOUS
  Administered 2013-10-14 (×2): 1 [IU] via SUBCUTANEOUS
  Administered 2013-10-14: 2 [IU] via SUBCUTANEOUS
  Administered 2013-10-14: 3 [IU] via SUBCUTANEOUS
  Administered 2013-10-15 (×2): 1 [IU] via SUBCUTANEOUS
  Administered 2013-10-15: 3 [IU] via SUBCUTANEOUS
  Administered 2013-10-15: 2 [IU] via SUBCUTANEOUS
  Administered 2013-10-16: 9 [IU] via SUBCUTANEOUS
  Administered 2013-10-16: 5 [IU] via SUBCUTANEOUS
  Administered 2013-10-16: 2 [IU] via SUBCUTANEOUS
  Administered 2013-10-16: 3 [IU] via SUBCUTANEOUS
  Administered 2013-10-16: 7 [IU] via SUBCUTANEOUS
  Administered 2013-10-17: 5 [IU] via SUBCUTANEOUS
  Administered 2013-10-17: 3 [IU] via SUBCUTANEOUS
  Administered 2013-10-17: 7 [IU] via SUBCUTANEOUS

## 2013-10-13 MED ORDER — PANTOPRAZOLE SODIUM 40 MG PO PACK
40.0000 mg | PACK | Freq: Every day | ORAL | Status: DC
  Administered 2013-10-13 – 2013-10-14 (×2): 40 mg
  Filled 2013-10-13 (×3): qty 20

## 2013-10-13 MED ORDER — POTASSIUM CHLORIDE 20 MEQ/15ML (10%) PO LIQD
40.0000 meq | Freq: Once | ORAL | Status: AC
  Administered 2013-10-13: 40 meq
  Filled 2013-10-13: qty 30

## 2013-10-13 MED ORDER — POTASSIUM CHLORIDE 20 MEQ/15ML (10%) PO LIQD
40.0000 meq | Freq: Once | ORAL | Status: AC
  Administered 2013-10-13: 40 meq

## 2013-10-13 NOTE — Progress Notes (Signed)
INITIAL NUTRITION ASSESSMENT  DOCUMENTATION CODES Per approved criteria  -Not Applicable   INTERVENTION:  Utilize 29M PEPuP Protocol: change TF via OGT to Vital AF 1.2 at 45 ml/h (1080 ml per day) to provide 1296 kcals, 81 gm protein, 876 ml free water daily.  Continue free water flushes 200 ml TID.  NUTRITION DIAGNOSIS: Inadequate oral intake related to inability to eat as evidenced by NPO status.   Goal: Intake to meet >90% of estimated nutrition needs.  Monitor:  TF tolerance/adequacy, weight trend, labs, vent status.  Reason for Assessment: MD Consult for TF initiation and management.  80 y.o. female  Admitting Dx: AMS  ASSESSMENT: 80 year old F with dementia who accidentally overdosed on ~ 40 x 0.5 mg Klonopin tabs. Intubated for respiratory distress.   Discussed patient in ICU rounds today. Patient is tolerating TF well via OGT: Vital High Protein at 40 ml/h providing 960 kcals, 84 gm protein, 803 ml free water daily. Also receiving 200 ml free water flushes TID.   Nutrition Focused Physical Exam:  Subcutaneous Fat:  Orbital Region: WNL Upper Arm Region: WNL Thoracic and Lumbar Region: NA  Muscle:  Temple Region: mild depletion Clavicle Bone Region: mild depletion Clavicle and Acromion Bone Region: WNL Scapular Bone Region: NA Dorsal Hand: WNL Patellar Region: WNL Anterior Thigh Region: WNL Posterior Calf Region: WNL  Edema: none  Patient is currently intubated on ventilator support MV: 6.9 L/min Temp (24hrs), Avg:99 F (37.2 C), Min:98.5 F (36.9 C), Max:99.9 F (37.7 C)   Height: Ht Readings from Last 1 Encounters:  10/12/13 5\' 2"  (1.575 m)    Weight: Wt Readings from Last 1 Encounters:  10/13/13 126 lb 5.2 oz (57.3 kg)    Ideal Body Weight: 50 kg  % Ideal Body Weight: 115%  Wt Readings from Last 10 Encounters:  10/13/13 126 lb 5.2 oz (57.3 kg)  10/06/13 115 lb (52.164 kg)  08/28/13 115 lb (52.164 kg)  05/14/13 110 lb (49.896 kg)   02/12/13 108 lb (48.988 kg)  10/04/12 111 lb (50.349 kg)  07/03/12 110 lb (49.896 kg)  02/09/12 113 lb (51.256 kg)  12/06/11 111 lb (50.349 kg)  11/09/11 112 lb (50.803 kg)    Usual Body Weight: 110-115 lbs  % Usual Body Weight: 107%  BMI:  Body mass index is 23.1 kg/(m^2).  Estimated Nutritional Needs: Kcal: 1222 Protein: 70-80 gm Fluid: 1.3-1.5 L  Skin: no issues noted  Diet Order:  NPO  EDUCATION NEEDS: -Education not appropriate at this time   Intake/Output Summary (Last 24 hours) at 10/13/13 1250 Last data filed at 10/13/13 1000  Gross per 24 hour  Intake    660 ml  Output    575 ml  Net     85 ml    Last BM: None documented  Labs:   Recent Labs Lab 10/11/13 2248 10/12/13 0453 10/13/13 0237  NA 143 142 142  K 4.7 4.4 3.4*  CL 104 106 107  CO2 26 22 24   BUN 17 15 17   CREATININE 0.85 0.74 0.78  CALCIUM 9.4 8.9 8.7  MG 1.9 1.6  --   PHOS  --  3.2  --   GLUCOSE 197* 133* 165*    CBG (last 3)   Recent Labs  10/13/13 0433 10/13/13 0822 10/13/13 1202  GLUCAP 122* 125* 232*    Scheduled Meds: . antiseptic oral rinse  15 mL Mouth Rinse QID  . chlorhexidine  15 mL Mouth Rinse BID  . feeding supplement (  VITAL HIGH PROTEIN)  1,000 mL Per Tube Q24H  . free water  200 mL Per Tube 3 times per day  . heparin  5,000 Units Subcutaneous 3 times per day  . insulin aspart  0-9 Units Subcutaneous 6 times per day  . [START ON 10/14/2013] metFORMIN  500 mg Oral Q breakfast  . pantoprazole sodium  40 mg Per Tube Daily  . potassium chloride  40 mEq Per Tube Once    Continuous Infusions: . sodium chloride 20 mL/hr at 10/12/13 1724    Past Medical History  Diagnosis Date  . Allergy   . GERD (gastroesophageal reflux disease)   . Chronic kidney disease     stones  . Ulcer   . Hyperlipidemia   . Hx of colonic polyps   . Arrhythmia   . Diabetes mellitus   . Dementia     Past Surgical History  Procedure Laterality Date  . Tonsillectomy    .  Cataract extraction    . Cholecystectomy      Joaquin CourtsKimberly Jeidy Hoerner, RD, LDN, CNSC Pager [phone] After Hours Pager [phone]

## 2013-10-13 NOTE — Progress Notes (Signed)
PULMONARY / CRITICAL CARE MEDICINE HISTORY AND PHYSICAL EXAMINATION   Name: Colleen SandhoffLucy Collins MRN: [id number] DOB: 08-13-1922    ADMISSION DATE:  10/11/2013  PRIMARY SERVICE: PCCM  CHIEF COMPLAINT:  AMS  BRIEF PATIENT DESCRIPTION: [age] F with dementia who accidentally overdosed on ~ 40 x 0.5 mg tabs. Intubated in ED for respiratory distress on presentation 7/04.   SIGNIFICANT EVENTS / STUDIES:  7/05 Head CT: NAICP, atrophy noted  LINES / TUBES: ETT 7/04 >>   CULTURES: None  ANTIBIOTICS: None   SUBJECTIVE:  Minimally responsive  VITAL SIGNS: Temp:  [98.5 F (36.9 C)-99.9 F (37.7 C)] 98.8 F (37.1 C) (07/06 0800) Pulse Rate:  [78-98] 98 (07/06 1146) Resp:  [17-19] 19 (07/06 1146) BP: (118-166)/(51-89) 166/62 mmHg (07/06 1146) SpO2:  [99 %-100 %] 100 % (07/06 1146) FiO2 (%):  [30 %-40 %] 30 % (07/06 1146) Weight:  [57.3 kg (126 lb 5.2 oz)] 57.3 kg (126 lb 5.2 oz) (07/06 0500) HEMODYNAMICS:   VENTILATOR SETTINGS: Vent Mode:  [-] PSV;CPAP FiO2 (%):  [30 %-40 %] 30 % Set Rate:  [18 bmp] 18 bmp Vt Set:  [380 mL] 380 mL PEEP:  [5 cmH20] 5 cmH20 Delta P (Amplitude):  [20] 20 Pressure Support:  [5 cmH20] 5 cmH20 Plateau Pressure:  [14 cmH20-19 cmH20] 18 cmH20 INTAKE / OUTPUT: Intake/Output     07/05 0701 - 07/06 0700 07/06 0701 - 07/07 0700   I.V. (mL/kg) 170 (3) 30 (0.5)   NG/GT 340 120   Total Intake(mL/kg) 510 (8.9) 150 (2.6)   Urine (mL/kg/hr) 340 (0.2) 235 (0.5)   Total Output 340 235   Net +170 -85          PHYSICAL EXAMINATION: General:  Sedated on vent HEENT: NCAT, ETT PULM: CTA B CV: RRR, no mgr AB: BS+, soft, nontender Ext: warm, no edema Neuro: minimally responsive, increased tone throughout  LABS: I have reviewed all of today's lab results. Relevant abnormalities are discussed in the A/P section  CXR: NNF  ASSESSMENT / PLAN:  Active Problems:   Altered mental status   Respiratory failure   Benzodiazepine (tranquilizer) overdose    Diabetes  NEUROLOGIC A: Severe dementia Acute encephalopathy due to inadvertent benzodiazepine overdose P:   Monitor off all sedatives  PULMONARY A: Acute Respiratory Failure: due to clonazepam overdose P:   Cont full vent support - settings reviewed and/or adjusted PSV as tolerated Cont vent bundle Daily SBT if/when meets criteria  CARDIOVASCULAR A: No acute issue Monitor  RENAL A: No acute issue Monitor BMET intermittently Monitor I/Os Correct electrolytes as indicated  GASTROINTESTINAL A: No acute issue Cont SUP Cont TFs  HEMATOLOGIC A: No acute issue DVT px: SQ heparin Monitor CBC intermittently Transfuse per usual ICU guidelines  INFECTIOUS A: No acute issue Monitor  ENDOCRINE A: DM 2 P:   Cont SSI Resume metformin @ reduced dose    CCM X 35 mins  Billy Fischeravid Simonds, MD ; [facility] Mobile [phone].  After 5:30 PM or weekends, call [phone]   10/13/2013, 2:44 PM

## 2013-10-14 ENCOUNTER — Inpatient Hospital Stay (HOSPITAL_COMMUNITY): Payer: Medicare Other

## 2013-10-14 ENCOUNTER — Telehealth: Payer: Self-pay | Admitting: Family Medicine

## 2013-10-14 DIAGNOSIS — F0280 Dementia in other diseases classified elsewhere without behavioral disturbance: Secondary | ICD-10-CM

## 2013-10-14 DIAGNOSIS — G309 Alzheimer's disease, unspecified: Secondary | ICD-10-CM

## 2013-10-14 LAB — GLUCOSE, CAPILLARY
GLUCOSE-CAPILLARY: 220 mg/dL — AB (ref 70–99)
GLUCOSE-CAPILLARY: 167 mg/dL — AB (ref 70–99)
Glucose-Capillary: 246 mg/dL — ABNORMAL HIGH (ref 70–99)
Glucose-Capillary: 122 mg/dL — ABNORMAL HIGH (ref 70–99)
Glucose-Capillary: 177 mg/dL — ABNORMAL HIGH (ref 70–99)
Glucose-Capillary: 128 mg/dL — ABNORMAL HIGH (ref 70–99)

## 2013-10-14 LAB — BASIC METABOLIC PANEL
ANION GAP: 14 (ref 5–15)
BUN: 19 mg/dL (ref 6–23)
CALCIUM: 8.7 mg/dL (ref 8.4–10.5)
CHLORIDE: 102 meq/L (ref 96–112)
CO2: 22 mEq/L (ref 19–32)
Creatinine, Ser: 0.74 mg/dL (ref 0.50–1.10)
GFR, EST AFRICAN AMERICAN: 85 mL/min — AB (ref >90)
GFR, EST NON AFRICAN AMERICAN: 74 mL/min — AB (ref >90)
Glucose, Bld: 251 mg/dL — ABNORMAL HIGH (ref 70–99)
Potassium: 3.9 mEq/L (ref 3.7–5.3)
Sodium: 138 mEq/L (ref 137–147)

## 2013-10-14 LAB — CBC
HCT: 33.1 % — ABNORMAL LOW (ref 36.0–46.0)
HEMOGLOBIN: 11 g/dL — AB (ref 12.0–15.0)
MCH: 32.7 pg (ref 26.0–34.0)
MCHC: 33.2 g/dL (ref 30.0–36.0)
MCV: 98.5 fL (ref 78.0–100.0)
Platelets: 186 10*3/uL (ref 150–400)
RBC: 3.36 MIL/uL — ABNORMAL LOW (ref 3.87–5.11)
RDW: 12.8 % (ref 11.5–15.5)
WBC: 8 10*3/uL (ref 4.0–10.5)

## 2013-10-14 MED ORDER — ENOXAPARIN SODIUM 30 MG/0.3ML ~~LOC~~ SOLN
30.0000 mg | SUBCUTANEOUS | Status: DC
  Administered 2013-10-14 – 2013-10-16 (×3): 30 mg via SUBCUTANEOUS
  Filled 2013-10-14 (×4): qty 0.3

## 2013-10-14 NOTE — Progress Notes (Signed)
PULMONARY / CRITICAL CARE MEDICINE HISTORY AND PHYSICAL EXAMINATION   Name: Colleen SandhoffLucy Collins MRN: [id number] DOB: [birth year]    ADMISSION DATE:  10/11/2013  PRIMARY SERVICE: PCCM  CHIEF COMPLAINT:  AMS  BRIEF PATIENT DESCRIPTION: [age] F with dementia who accidentally overdosed on ~ 40 x 0.5 mg tabs. Intubated in ED for respiratory distress on presentation 7/04.   SIGNIFICANT EVENTS / STUDIES:  7/05 Head CT: NAICP, atrophy noted 7/06 Remains minimally responsive 7/07 Withdraws and spont cough. Not F/C. Passed SBT. Extubation ordered  LINES / TUBES: ETT 7/04 >> 7/07  CULTURES: None  ANTIBIOTICS: None   SUBJECTIVE:  Withdraws and spont cough. Not F/C. Passed SBT  VITAL SIGNS: Temp:  [98.9 F (37.2 C)-99.6 F (37.6 C)] 99.2 F (37.3 C) (07/07 1321) Pulse Rate:  [84-96] 96 (07/07 0826) Resp:  [16-22] 18 (07/07 0826) BP: (115-179)/(53-85) 145/85 mmHg (07/07 0826) SpO2:  [98 %-100 %] 99 % (07/07 0826) FiO2 (%):  [30 %] 30 % (07/07 0826) Weight:  [57.8 kg (127 lb 6.8 oz)] 57.8 kg (127 lb 6.8 oz) (07/07 0456) HEMODYNAMICS:   VENTILATOR SETTINGS: Vent Mode:  [-] PSV;CPAP FiO2 (%):  [30 %] 30 % Set Rate:  [18 bmp] 18 bmp Vt Set:  [380 mL] 380 mL PEEP:  [5 cmH20] 5 cmH20 Pressure Support:  [5 cmH20] 5 cmH20 Plateau Pressure:  [12 cmH20-17 cmH20] 17 cmH20 INTAKE / OUTPUT: Intake/Output     07/06 0701 - 07/07 0700 07/07 0701 - 07/08 0700   I.V. (mL/kg) 340 (5.9)    NG/GT 1445    Total Intake(mL/kg) 1785 (30.9)    Urine (mL/kg/hr) 1335 (1)    Total Output 1335     Net +450            PHYSICAL EXAMINATION: General:  RASS -1, not F/C HEENT: WNL PULM: CTA B CV: RRR, no M AB: BS+, soft, nontender Ext: warm, no edema Neuro: minimally responsive, increased tone throughout  LABS: I have reviewed all of today's lab results. Relevant abnormalities are discussed in the A/P section  CXR: NNF  ASSESSMENT / PLAN:  Active Problems:   Altered mental status   Respiratory  failure   Benzodiazepine (tranquilizer) overdose   Diabetes  NEUROLOGIC A: Severe dementia Acute encephalopathy due to inadvertent benzodiazepine overdose P:   Monitor off all sedatives  PULMONARY A: Acute Respiratory Failure, due to clonazepam overdose P:   Extubate Supp O2  CARDIOVASCULAR A: No acute issue Monitor  RENAL A: No acute issue Monitor BMET intermittently Monitor I/Os Correct electrolytes as indicated  GASTROINTESTINAL A: No acute issue SUP: N/I post ext Replace OGT with NGT Cont TFs  HEMATOLOGIC A: No acute issue DVT px: enoxaparin Monitor CBC intermittently Transfuse per usual ICU guidelines  INFECTIOUS A: No acute issue Monitor  ENDOCRINE A: DM 2 P:   Cont SSI Cont metformin @ reduced dose    CCM X 30 mins  Billy Fischeravid Neftali Thurow, MD ; [facility] service Mobile [phone].  After 5:30 PM or weekends, call [phone]   10/14/2013, 2:02 PM

## 2013-10-14 NOTE — Progress Notes (Signed)
Came to visit patient at bedside. [facility] Nursing reports family no family present in waiting room either. Will continue to follow. Made inpatient RNCM aware [facility] is active.  Junie Panningtika Willow Reczek,MSN-RN,BSNMedical Center Nav[facility] Care [facility] Hospital [phone]- [phone]

## 2013-10-14 NOTE — Procedures (Signed)
Extubation Procedure Note  Patient Details:   Name: Colleen Collins DOB: [birth year] MRN: [id number]   Airway Documentation:     Evaluation  O2 sats: stable throughout Complications: No apparent complications Patient did tolerate procedure well. Bilateral Breath Sounds: Clear;Diminished Suctioning: Airway Yes  Order received for extubation.  Cuff leak positive prior to extubation.  Placed on 3l [place].  No stridor noted, no complications noted.  Patient able to vocalize.  Will continue to monitor.  Lysbeth PennerSmith, Cailie Bosshart [facility] 10/14/2013, 4:08 PM

## 2013-10-14 NOTE — Telephone Encounter (Signed)
[id number] - [place], [place] - [address] is requesting re-fill on LORazepam (ATIVAN) 0.5 MG tablet

## 2013-10-14 NOTE — Progress Notes (Signed)
Inpatient Diabetes Program Recommendations  AACE/ADA: New Consensus Statement on Inpatient Glycemic Control (2013)  Target Ranges:  Prepandial:   less than 140 mg/dL      Peak postprandial:   less than 180 mg/dL (1-2 hours)      Critically ill patients:  140 - 180 mg/dL   Reason for Assessment:  Results for Colleen Collins, Colleen Collins (MRN [id number]) as of 10/14/2013 07:36  Ref. Range 10/13/2013 15:57 10/13/2013 19:30 10/13/2013 23:26 10/14/2013 04:21  Glucose-Capillary Latest Range: 70-99 mg/dL 119204 (H) 147235 (H) 829246 (H) 220 (H)   Diabetes history: Type 2 diabetes Outpatient Diabetes medications: Metformin, Diabeta Current orders for Inpatient glycemic control: Novolog sensitive q 4 hours, Metformin 500 mg daily  Consider holding Metformin while patient is in the hospital. Also may consider adding Novolog tube feed coverage 2 units q 4 hours (to be held when tube feeds held).  Beryl MeagerJenny Suhailah Kwan, RN, BC-ADM Inpatient Diabetes Coordinator Pager [phone]

## 2013-10-15 DIAGNOSIS — T424X4A Poisoning by benzodiazepines, undetermined, initial encounter: Secondary | ICD-10-CM

## 2013-10-15 DIAGNOSIS — G9340 Encephalopathy, unspecified: Secondary | ICD-10-CM

## 2013-10-15 DIAGNOSIS — J9600 Acute respiratory failure, unspecified whether with hypoxia or hypercapnia: Secondary | ICD-10-CM

## 2013-10-15 DIAGNOSIS — T424X1A Poisoning by benzodiazepines, accidental (unintentional), initial encounter: Secondary | ICD-10-CM

## 2013-10-15 LAB — CBC
HEMATOCRIT: 35.6 % — AB (ref 36.0–46.0)
HEMOGLOBIN: 12 g/dL (ref 12.0–15.0)
MCH: 33.6 pg (ref 26.0–34.0)
MCHC: 33.7 g/dL (ref 30.0–36.0)
MCV: 99.7 fL (ref 78.0–100.0)
Platelets: 205 10*3/uL (ref 150–400)
RBC: 3.57 MIL/uL — ABNORMAL LOW (ref 3.87–5.11)
RDW: 13.1 % (ref 11.5–15.5)
WBC: 5.9 10*3/uL (ref 4.0–10.5)

## 2013-10-15 LAB — GLUCOSE, CAPILLARY
GLUCOSE-CAPILLARY: 145 mg/dL — AB (ref 70–99)
GLUCOSE-CAPILLARY: 234 mg/dL — AB (ref 70–99)
Glucose-Capillary: 111 mg/dL — ABNORMAL HIGH (ref 70–99)
Glucose-Capillary: 119 mg/dL — ABNORMAL HIGH (ref 70–99)
Glucose-Capillary: 119 mg/dL — ABNORMAL HIGH (ref 70–99)
Glucose-Capillary: 144 mg/dL — ABNORMAL HIGH (ref 70–99)
Glucose-Capillary: 147 mg/dL — ABNORMAL HIGH (ref 70–99)

## 2013-10-15 LAB — BASIC METABOLIC PANEL
Anion gap: 15 (ref 5–15)
BUN: 18 mg/dL (ref 6–23)
CALCIUM: 9 mg/dL (ref 8.4–10.5)
CO2: 26 mEq/L (ref 19–32)
Chloride: 104 mEq/L (ref 96–112)
Creatinine, Ser: 0.78 mg/dL (ref 0.50–1.10)
GFR calc Af Amer: 84 mL/min — ABNORMAL LOW (ref >90)
GFR, EST NON AFRICAN AMERICAN: 73 mL/min — AB (ref >90)
GLUCOSE: 117 mg/dL — AB (ref 70–99)
Potassium: 4 mEq/L (ref 3.7–5.3)
Sodium: 145 mEq/L (ref 137–147)

## 2013-10-15 MED ORDER — PANTOPRAZOLE SODIUM 40 MG PO TBEC: 40.0000 mg | DELAYED_RELEASE_TABLET | Freq: Every day | ORAL | Status: DC

## 2013-10-15 MED ORDER — VITAL HIGH PROTEIN PO LIQD
1000.0000 mL | ORAL | Status: DC
  Filled 2013-10-15 (×2): qty 1000

## 2013-10-15 MED ORDER — JEVITY 1.2 CAL PO LIQD
1000.0000 mL | ORAL | Status: DC
  Administered 2013-10-15: 18:00:00
  Filled 2013-10-15 (×5): qty 1000

## 2013-10-15 MED ORDER — GLYBURIDE 5 MG PO TABS
5.0000 mg | ORAL_TABLET | Freq: Every day | ORAL | Status: DC
  Administered 2013-10-16 – 2013-10-17 (×2): 5 mg
  Filled 2013-10-15 (×3): qty 1

## 2013-10-15 MED ORDER — DOCUSATE SODIUM 50 MG/5ML PO LIQD
100.0000 mg | Freq: Two times a day (BID) | ORAL | Status: DC
  Administered 2013-10-15 – 2013-10-17 (×5): 100 mg
  Filled 2013-10-15 (×6): qty 10

## 2013-10-15 MED ORDER — METFORMIN HCL 500 MG PO TABS
250.0000 mg | ORAL_TABLET | Freq: Two times a day (BID) | ORAL | Status: DC
  Administered 2013-10-15 – 2013-10-17 (×4): 250 mg via ORAL
  Filled 2013-10-15 (×6): qty 1

## 2013-10-15 MED ORDER — VITAL AF 1.2 CAL PO LIQD
1000.0000 mL | ORAL | Status: DC
  Administered 2013-10-15: 1000 mL
  Filled 2013-10-15 (×2): qty 1000

## 2013-10-15 MED ORDER — LORAZEPAM 0.5 MG PO TABS: 0.5000 mg | ORAL_TABLET | Freq: Every day | ORAL | Status: DC

## 2013-10-15 NOTE — Care Management Note (Signed)
    Page 1 of 1   10/15/2013     2:20:23 PM CARE MANAGEMENT NOTE 10/15/2013  Patient:  Colleen SandhoffBAKER,Marolyn   Account Number:  [id number]  Date Initiated:  10/15/2013  Documentation initiated by:  [facility],Sarahelizabeth Conway  Subjective/Objective Assessment:   Admitted with unintentional OD.     Action/Plan:   Anticipated DC Date:  10/17/2013   Anticipated DC Plan:  HOME W HOME HEALTH SERVICES      DC Planning Services  CM consult      Choice offered to / List presented to:             Status of service:  In process, will continue to follow Medicare Important Message given?  YES (If response is "NO", the following Medicare IM given date fields will be blank) Date Medicare IM given:  10/15/2013 Medicare IM given by:  [facility],Neftaly Inzunza Date Additional Medicare IM given:   Additional Medicare IM given by:    Discharge Disposition:    Per UR Regulation:  Reviewed for med. necessity/level of care/duration of stay  If discussed at Long Length of Stay Meetings, dates discussed:    Comments:  Contact:  Spouse - [phone] Helane GuntherFrank Tawil  POA and                 Jeannene PatellaFriend Chris Gleason - [phone] co POA  10-15-13 11:45am Avie ArenasSarah Cristin Szatkowski, RNBSN [phone] Lives with husband - who is with her during the day.  [facility].  Night time is usually when she is most active.  Doesn't usually go to sleep till between 4 and 7am.  Physically functional.  Ambulates by self most of time.  Needs feeding and assistance with all ADL's.  Can use bathroom but sometimes cant find bathroom so uses floor or whatever.  Try to take to bathroom and sit her then she will use.   [facility] with HH RN, NA and SW.   Will need orders.  Mr Excell SeltzerBaker was given choice of HH agency and would like [facility] on discharge.  Mr Excell SeltzerBaker also informed that at this time we are hoping that she will progress to earlier level and could go home but if she does not progress she may not be able to go home.   States he does understand that, but feels she will wake up and when she does she will be very active.

## 2013-10-15 NOTE — Progress Notes (Addendum)
NUTRITION FOLLOW UP / CONSULT  Intervention:    Resume 48M PEPuP Protocol: initiate TF via NGT with Jevity 1.2 at 25 ml/h on day 1; on day 2, increase to goal rate of 45 ml/h (1080 ml per day) to provide 1296 kcals, 60 gm protein, 875 ml free water daily.  Nutrition Dx:   Inadequate oral intake related to inability to eat as evidenced by NPO status, ongoing.  Goal:   Intake to meet >90% of estimated nutrition needs. Unmet.  Monitor:   TF initiation/tolerance/adequacy, weight trend, labs, mental status, ability to resume PO diet.  Assessment:   80 year old F with dementia who accidentally overdosed on ~ 40 x 0.5 mg Klonopin tabs. Intubated for respiratory distress.   Patient was extubated on 7/7. Was receiving TF while intubated, off since extubation. She is still not alert enough to take PO's. Per discussion in ICU rounds, TF needs to be resumed, NG tube is in place. Receiving MD Consult for TF initiation and management.  Height: Ht Readings from Last 1 Encounters:  10/12/13 5\' 2"  (1.575 m)    Weight Status:   Wt Readings from Last 1 Encounters:  10/15/13 116 lb 6.5 oz (52.8 kg)  10/13/13  126 lb 5.2 oz (57.3 kg)   Re-estimated needs:  Kcal: 1200-1400 Protein: 60-70 gm Fluid: 1.2-1.4 L  Skin: no issues  Diet Order:  NPO   Intake/Output Summary (Last 24 hours) at 10/15/13 1226 Last data filed at 10/15/13 0806  Gross per 24 hour  Intake    490 ml  Output    820 ml  Net   -330 ml     Last BM: PTA (hx of constipation at home)   Labs:   Recent Labs Lab 10/11/13 2248 10/12/13 0453 10/13/13 0237 10/14/13 0446 10/15/13 0520  NA 143 142 142 138 145  K 4.7 4.4 3.4* 3.9 4.0  CL 104 106 107 102 104  CO2 26 22 24 22 26   BUN 17 15 17 19 18   CREATININE 0.85 0.74 0.78 0.74 0.78  CALCIUM 9.4 8.9 8.7 8.7 9.0  MG 1.9 1.6  --   --   --   PHOS  --  3.2  --   --   --   GLUCOSE 197* 133* 165* 251* 117*    CBG (last 3)   Recent Labs  10/15/13 0026 10/15/13 0347  10/15/13 0843  GLUCAP 119* 111* 119*    Scheduled Meds: . antiseptic oral rinse  15 mL Mouth Rinse QID  . chlorhexidine  15 mL Mouth Rinse BID  . docusate  100 mg Per Tube BID  . enoxaparin (LOVENOX) injection  30 mg Subcutaneous Q24H  . [START ON 10/16/2013] glyBURIDE  5 mg Per Tube Q breakfast  . insulin aspart  0-9 Units Subcutaneous 6 times per day  . metFORMIN  250 mg Oral BID WC    Continuous Infusions:  None   Joaquin CourtsKimberly Harris, RD, LDN, CNSC Pager [phone] After Hours Pager [phone]

## 2013-10-15 NOTE — Progress Notes (Signed)
Report given to RN Morrie SheldonAshley 6 east room 3

## 2013-10-15 NOTE — Telephone Encounter (Signed)
Rx called into the pharmacy. 

## 2013-10-15 NOTE — Progress Notes (Signed)
PULMONARY / CRITICAL CARE MEDICINE HISTORY AND PHYSICAL EXAMINATION   Name: Colleen Collins MRN: [id number] DOB: 09/29/1922    ADMISSION DATE:  10/11/2013  PRIMARY SERVICE: PCCM  CHIEF COMPLAINT:  AMS  BRIEF PATIENT DESCRIPTION: [age] F with dementia who accidentally overdosed on ~ 40 x 0.5 mg tabs. Intubated in ED for respiratory distress on presentation 7/04.   SIGNIFICANT EVENTS / STUDIES:  7/05 Head CT: NAICP, atrophy noted 7/06 Remains minimally responsive 7/07 Withdraws and spont cough. Not F/C. Passed SBT. Extubation ordered  LINES / TUBES: ETT 7/04 >> 7/07  CULTURES: None  ANTIBIOTICS: None   SUBJECTIVE:  Has tolerated extubation without distress. Nonverbal. Appears more alert. Not F/C  VITAL SIGNS: Temp:  [98.2 F (36.8 C)-99.1 F (37.3 C)] 99.1 F (37.3 C) (07/08 1200) Pulse Rate:  [83-100] 100 (07/08 1500) Resp:  [12-36] 21 (07/08 1500) BP: (97-159)/(48-84) 148/56 mmHg (07/08 1500) SpO2:  [94 %-100 %] 94 % (07/08 1500) Weight:  [52.8 kg (116 lb 6.5 oz)] 52.8 kg (116 lb 6.5 oz) (07/08 0600) HEMODYNAMICS:   VENTILATOR SETTINGS:   INTAKE / OUTPUT: Intake/Output     07/07 0701 - 07/08 0700 07/08 0701 - 07/09 0700   I.V. (mL/kg) 480 (9.1) 20 (0.4)   NG/GT 285 30   Total Intake(mL/kg) 765 (14.5) 50 (0.9)   Urine (mL/kg/hr) 1465 (1.2) 155 (0.3)   Total Output 1465 155   Net -700 -105          PHYSICAL EXAMINATION: General: NAD HEENT: WNL PULM: CTA B CV: RRR, no M AB: BS+, soft, nontender Ext: warm, no edema Neuro: No focal deficits noted  LABS: I have reviewed all of today's lab results. Relevant abnormalities are discussed in the A/P section  CXR: NNF  ASSESSMENT / PLAN:  Active Problems:   Altered mental status   Respiratory failure   Benzodiazepine (tranquilizer) overdose   Diabetes  NEUROLOGIC A: Severe dementia Acute encephalopathy due to inadvertent benzodiazepine overdose P:   Avoid all sedatives  PULMONARY A: Acute Respiratory  Failure, resolved P:   Cont PRN Supp O2  GASTROINTESTINAL A: Dysphagia due to AMS SUP: N/I post ext Cont TFs per NGT  HEMATOLOGIC A: No acute issue DVT px: enoxaparin Monitor CBC intermittently Transfuse per usual ICU guidelines  ENDOCRINE A: DM 2 P:   Cont SSI Cont metformin @ reduced dose   Transfer to med-surg floor. TRH to assume care as of AM 7/09 and PCCM to sign off. Discussed with Dr Nichola SizerMcClung  David Simonds, MD ; [facility] Mobile [phone].  After 5:30 PM or weekends, call [phone]   10/15/2013, 4:13 PM

## 2013-10-15 NOTE — Telephone Encounter (Signed)
Pt is currently in the hospital for overdose. Notes are in patient chart.

## 2013-10-15 NOTE — Telephone Encounter (Signed)
Refill OK but pt's husband must make sure ALL meds are out of reach of pt at all times.

## 2013-10-16 ENCOUNTER — Inpatient Hospital Stay (HOSPITAL_COMMUNITY): Payer: Medicare Other

## 2013-10-16 DIAGNOSIS — Z515 Encounter for palliative care: Secondary | ICD-10-CM

## 2013-10-16 LAB — GLUCOSE, CAPILLARY
GLUCOSE-CAPILLARY: 354 mg/dL — AB (ref 70–99)
Glucose-Capillary: 214 mg/dL — ABNORMAL HIGH (ref 70–99)
Glucose-Capillary: 186 mg/dL — ABNORMAL HIGH (ref 70–99)
Glucose-Capillary: 293 mg/dL — ABNORMAL HIGH (ref 70–99)
Glucose-Capillary: 315 mg/dL — ABNORMAL HIGH (ref 70–99)

## 2013-10-16 MED ORDER — TRAZODONE 25 MG HALF TABLET
25.0000 mg | ORAL_TABLET | Freq: Once | ORAL | Status: AC
  Administered 2013-10-16: 25 mg via ORAL
  Filled 2013-10-16: qty 1

## 2013-10-16 MED ORDER — CLINDAMYCIN PHOSPHATE 600 MG/50ML IV SOLN
600.0000 mg | Freq: Three times a day (TID) | INTRAVENOUS | Status: DC
  Administered 2013-10-16 – 2013-10-17 (×3): 600 mg via INTRAVENOUS
  Filled 2013-10-16 (×5): qty 50

## 2013-10-16 NOTE — Progress Notes (Signed)
[facility] PROGRESS NOTE  Colleen Collins ZOX:[id number] DOB: [birth year] DOA: 10/11/2013 PCP: [name], MD  Brief narrative 80 year old female with advanced dementia, mild GERD, chronic kidney disease, foot ulcer, hyperlipidemia, diabetes mellitus, presented to the ED on 7/5 after she was found to be somnolent all day on 10/11/2013 aspirin her husband. He had left a bottle of Klonopin out and patient had access to it around 2 AM on 7/4. She then fell asleep on the couch throughout the day on 7/4 and since she did not wake up he was concerned and called EMS. Patient has advanced dementia but prior to that event did not have any change in her mental status. She possibly overdosed with about 40, 0.5 mg Klonopin tablets. Patient was intubated for respiratory distress. A Foley catheter was placed in. Did not use flumazenil with this of precipitating a seizure and given long acting nature of clonazepam she may require a drip. In the ED a head CT was done please did not show any acute findings except for chronic atrophy. Patient admitted to ICU.  Course in the ICU Patient remained intubated from 7/5-7/7. On 7/7 patient was withdrawn with spontaneous cough. She was successfully extubated and remained stable in the ICU. She was transferred to the floor on 7/8. Patient remained nonverbal and noncommunicative.. All sedatives have been held. Seen by nutrition consult and started on tube feed via NG tube with Jevity 1.2 at 25 mL per hour with increasing cold with a day to 45 mL per hour.   Assessment/Plan: Acute hypoxic respiratory failure with encephalopathy Secondary to benzodiazepine overdose. Patient has advanced dementia. Patient required intubation from 7/5-7/7 and was successfully extubated. Maintaining O2 sat on room air. Vitals are stable and patient remains afebrile. -Still has some underlying encephalopathy and all the way to commands is not communicative. As her husband she has advanced  dementia and is able to recognize him but does not getting out a normal conversation. She is able to ambulate with some support in the house.  Dysphagia with acute encephalopathy Will need regular swallow eval. Patient is at high risk for aspiration and has coarse breath sounds. Started on tube feeds via NG tube. Check bedside chest x-ray to rule out aspiration.  Diabetes mellitus type 2 Metformin resumed at a lower dose. Continue glyburide and SSI  Advanced dementia and FTT Patient has acute encephalopathy with high risk for aspiration. I have discussed in detail with her husband over the phone about goals of care. Her PCP had recently discussed above this with him and made her DO NOT RESUSCITATE. I have discussed with him about involving palliative care for consult on further clarifying goals of care given her poor mental status and high aspiration risk and husband agrees for discussion. Would also like to involve their Colleen Collins ( phone [phone]) to be involved in the discussion. Palliative consult placed  Diet: N.p.o. with tube feeds via NG  DVT prophylaxis: Subcutaneous Lovenox.  Code Status: DO NOT RESUSCITATE Family Communication: Discussed with husband over the phone osition Plan: Pending palliative care consult, improvement in mental status and NPO status  Consultants: PCCM palliative care   Procedures: Intubation on 7/5, extubated on 7/7 Foley catheter in ED   Antibiotics:  None  HPI/Subjective: Patient seen and examined. Poorly arousable  Objective: Filed Vitals:   10/16/13 0419  BP: 137/52  Pulse: 95  Temp: 98.1 F (36.7 C)  Resp: 15    Intake/Output Summary (Last 24 hours) at 10/16/13 0847 Last  data filed at 10/16/13 0800  Gross per 24 hour  Intake 260.83 ml  Output    370 ml  Net -109.17 ml   Filed Weights   10/14/13 0456 10/15/13 0600 10/15/13 1956  Weight: 57.8 kg (127 lb 6.8 oz) 52.8 kg (116 lb 6.5 oz) 52.802 kg (116 lb 6.5 oz)     Exam:   General:  Elderly frail female lying in bed , poorly arousable  HEENT: NG in place, no pallor, moist mucosa  Cardiovascular: Normal S1-S2, no murmurs rub or gallop  Respiratory: Coarse breath sounds bilaterally, no rhonchi or wheeze  Abdomen: Soft, nontender, nondistended, bowel sounds present, foley in place  Musculoskeletal: Warm, no edema  CNS: poorly arousable, not communicative or oriented.   Data Reviewed: Basic Metabolic Panel:  Recent Labs Lab 10/11/13 2248 10/12/13 0453 10/13/13 0237 10/14/13 0446 10/15/13 0520  NA 143 142 142 138 145  K 4.7 4.4 3.4* 3.9 4.0  CL 104 106 107 102 104  CO2 26 22 24 22 26   GLUCOSE 197* 133* 165* 251* 117*  BUN 17 15 17 19 18   CREATININE 0.85 0.74 0.78 0.74 0.78  CALCIUM 9.4 8.9 8.7 8.7 9.0  MG 1.9 1.6  --   --   --   PHOS  --  3.2  --   --   --    Liver Function Tests:  Recent Labs Lab 10/11/13 2248  AST 32  ALT 20  ALKPHOS 77  BILITOT 0.4  PROT 7.9  ALBUMIN 3.8   No results found for this basename: LIPASE, AMYLASE,  in the last 168 hours No results found for this basename: AMMONIA,  in the last 168 hours CBC:  Recent Labs Lab 10/11/13 2248 10/12/13 0453 10/14/13 0446 10/15/13 0520  WBC 5.4 8.7 8.0 5.9  HGB 12.8 13.6 11.0* 12.0  HCT 37.9 40.9 33.1* 35.6*  MCV 99.0 101.0* 98.5 99.7  PLT 215 201 186 205   Cardiac Enzymes:  Recent Labs Lab 10/11/13 2248  CKTOTAL 44   BNP (last 3 results) No results found for this basename: PROBNP,  in the last 8760 hours CBG:  Recent Labs Lab 10/15/13 1645 10/15/13 1954 10/15/13 2357 10/16/13 0423 10/16/13 0732  GLUCAP 145* 147* 234* 214* 186*    Recent Results (from the past 240 hour(s))  MRSA PCR SCREENING     Status: None   Collection Time    10/12/13  3:54 AM      Result Value Ref Range Status   MRSA by PCR NEGATIVE  NEGATIVE Final   Comment:            The GeneXpert MRSA Assay (FDA     approved for NASAL specimens     only), is one  component of a     comprehensive MRSA colonization     surveillance program. It is not     intended to diagnose MRSA     infection nor to guide or     monitor treatment for     MRSA infections.     Studies: Dg Abd Portable 1v  10/14/2013   CLINICAL DATA:  Feeding tube placement at bedside.  EXAM: PORTABLE ABDOMEN - 1 VIEW  COMPARISON:  Portable abdomen x-ray 10/12/2013.  FINDINGS: Feeding tube tip projects over the distal body of the J shaped stomach. Visualized upper abdominal bowel gas pattern unremarkable. Large stool burden throughout the visualized colon.  IMPRESSION: 1. Feeding tube tip in the distal body of the stomach. 2. No  acute abdominal abnormality.  Large stool burden.   Electronically Signed   By: Hulan Saas M.D.   On: 10/14/2013 16:29    Scheduled Meds: . antiseptic oral rinse  15 mL Mouth Rinse QID  . chlorhexidine  15 mL Mouth Rinse BID  . docusate  100 mg Per Tube BID  . enoxaparin (LOVENOX) injection  30 mg Subcutaneous Q24H  . glyBURIDE  5 mg Per Tube Q breakfast  . insulin aspart  0-9 Units Subcutaneous 6 times per day  . metFORMIN  250 mg Oral BID WC   Continuous Infusions: . feeding supplement (JEVITY 1.2 CAL) 1,000 mL (10/16/13 0725)      Time spent: 25 minuites    Pattiann Solanki  [facility] Pager [phone]. If 7PM-7AM, please contact night-coverage at [email or web address], password [facility] 10/16/2013, 8:47 AM  LOS: 5 days

## 2013-10-16 NOTE — Progress Notes (Signed)
NUTRITION FOLLOW UP  Intervention:   Continue Jevity 1.2 at 45 ml/hr, provides 1296 kcals, 60 gm protein, 875 ml free water daily. RD to continue to follow nutrition care plan.  Nutrition Dx:   Inadequate oral intake related to inability to eat as evidenced by NPO status, ongoing.  Goal:   Intake to meet >90% of estimated nutrition needs. Unmet.  Monitor:   TF initiation/tolerance/adequacy, weight trend, labs, mental status, ability to resume PO diet, GOC  Assessment:   80 year old F with dementia who accidentally overdosed on ~ 40 x 0.5 mg Klonopin tabs. Intubated for respiratory distress.   Patient was extubated on 7/7. Was receiving TF while intubated, off since extubation. She is still not alert enough to take PO's. NG tube is in place. Receiving Jevity 1.2 at 45 ml/hr. RN confirms that pt is tolerating well.  Palliative care consult in place at this time.  CBG's: 186 - 293  Height: Ht Readings from Last 1 Encounters:  10/12/13 5\' 2"  (1.575 m)    Weight Status:   Wt Readings from Last 1 Encounters:  10/15/13 116 lb 6.5 oz (52.802 kg)  10/13/13  126 lb 5.2 oz (57.3 kg)   Re-estimated needs:  Kcal: 1200-1400 Protein: 60-70 gm Fluid: 1.2-1.4 L  Skin: stage I pressure ulcer to sacrum  Diet Order:  NPO   Intake/Output Summary (Last 24 hours) at 10/16/13 1141 Last data filed at 10/16/13 0800  Gross per 24 hour  Intake 260.83 ml  Output    370 ml  Net -109.17 ml     Last BM: PTA (hx of constipation at home)   Labs:   Recent Labs Lab 10/11/13 2248 10/12/13 0453 10/13/13 0237 10/14/13 0446 10/15/13 0520  NA 143 142 142 138 145  K 4.7 4.4 3.4* 3.9 4.0  CL 104 106 107 102 104  CO2 26 22 24 22 26   BUN 17 15 17 19 18   CREATININE 0.85 0.74 0.78 0.74 0.78  CALCIUM 9.4 8.9 8.7 8.7 9.0  MG 1.9 1.6  --   --   --   PHOS  --  3.2  --   --   --   GLUCOSE 197* 133* 165* 251* 117*    CBG (last 3)   Recent Labs  10/15/13 2357 10/16/13 0423 10/16/13 0732   GLUCAP 234* 214* 186*    Scheduled Meds: . antiseptic oral rinse  15 mL Mouth Rinse QID  . chlorhexidine  15 mL Mouth Rinse BID  . clindamycin (CLEOCIN) IV  600 mg Intravenous 3 times per day  . docusate  100 mg Per Tube BID  . enoxaparin (LOVENOX) injection  30 mg Subcutaneous Q24H  . glyBURIDE  5 mg Per Tube Q breakfast  . insulin aspart  0-9 Units Subcutaneous 6 times per day  . metFORMIN  250 mg Oral BID WC    Continuous Infusions: . feeding supplement (JEVITY 1.2 CAL) 1,000 mL (10/16/13 0725)   Jarold MottoSamantha Ivett Luebbe MS, RD, LDN Inpatient Registered Dietitian Pager: [phone] After-hours pager: [phone]

## 2013-10-16 NOTE — Progress Notes (Signed)
Potable CXR concerning for RLL infiltrate. Pt at high risk for aspiration. Will place on empiric IV clindamycin

## 2013-10-16 NOTE — Consult Note (Signed)
Patient Colleen Collins      DOB: 07/23/1922      EAV:[id number]     Consult Note from the Palliative Medicine Team at [facility]    Consult Requested by:Dr Dhungal    PCP: Colleen Covey, MD Reason for Consultation: GOC    Phone Number:[phone]  Assessment/Recomendations: 80 yo female with sever dementia, DM who presented with respiratory failure after presumed accidental benzodiazepine overdose requiring intubation/mechanical ventilation. Current hospital course complicated by prolonged encephalopathy. Palliative Care consulted for goals of care.    1.  Code Status: DNR  2. Goals of Care: Family meeting planned for tomorrow at 1pm with husband and godson.   3. Symptom Management:  1. Dysphagia/FTT- currently with DHT for feeds. Will need to see how she progresses or does not and further address goals with husband.  4. Psychosocial: Lives with husband Colleen Collins.  Also receives assistance from [employer]     Brief HPI: 80 yo female with history of severe dementia (presumed Alzheimers per previous notes), DM who presented with somnolence and respiratory failure on 10/12/13.  Respiratory failure required intubation with mechanical ventilation.  Etiology is suspceted to be from klonopin with appx 40 0.5mg  tabs taken as accidental ingestion by patient at night. She was able to be extubated on 10/14/13.  She has had continued encephalopathy and unable to provide any history as she is non-verbal and non-communicative.     ROS: Unable to obtain 2/2 cognitive impairment.     PMH:  Past Medical History  Diagnosis Date  . Allergy   . GERD (gastroesophageal reflux disease)   . Chronic kidney disease     stones  . Ulcer   . Hyperlipidemia   . Hx of colonic polyps   . Arrhythmia   . Diabetes mellitus   . Dementia      PSH: Past Surgical History  Procedure Laterality Date  . Tonsillectomy    . Cataract extraction    . Cholecystectomy     I have reviewed the  FH and SH and  If appropriate update it with new information. No Known Allergies Scheduled Meds: . antiseptic oral rinse  15 mL Mouth Rinse QID  . chlorhexidine  15 mL Mouth Rinse BID  . clindamycin (CLEOCIN) IV  600 mg Intravenous 3 times per day  . docusate  100 mg Per Tube BID  . enoxaparin (LOVENOX) injection  30 mg Subcutaneous Q24H  . glyBURIDE  5 mg Per Tube Q breakfast  . insulin aspart  0-9 Units Subcutaneous 6 times per day  . metFORMIN  250 mg Oral BID WC   Continuous Infusions: . feeding supplement (JEVITY 1.2 CAL) 1,000 mL (10/16/13 0725)   PRN Meds:.    BP 136/43  Pulse 98  Temp(Src) 98.3 F (36.8 C) (Axillary)  Resp 16  Ht 5\' 2"  (1.575 m)  Wt 52.802 kg (116 lb 6.5 oz)  BMI 21.29 kg/m2  SpO2 91%   PPS: 10   Intake/Output Summary (Last 24 hours) at 10/16/13 1315 Last data filed at 10/16/13 1200  Gross per 24 hour  Intake 320.83 ml  Output    370 ml  Net -49.17 ml    Physical Exam:  General: No acute distress, opens eyes some to voice HEENT:  [place], DHT Chest:  symm expansion, CTAB CVS: RRR Abdomen: soft, NT Ext: no c/c Neuro: not following commands and unable to communicate  Labs: CBC    Component Value Date/Time   WBC 5.9 10/15/2013 0520  RBC 3.57* 10/15/2013 0520   HGB 12.0 10/15/2013 0520   HCT 35.6* 10/15/2013 0520   PLT 205 10/15/2013 0520   MCV 99.7 10/15/2013 0520   MCH 33.6 10/15/2013 0520   MCHC 33.7 10/15/2013 0520   RDW 13.1 10/15/2013 0520   LYMPHSABS 1.2 10/10/2011 1746   MONOABS 0.4 10/10/2011 1746   EOSABS 0.1 10/10/2011 1746   BASOSABS 0.0 10/10/2011 1746    BMET    Component Value Date/Time   NA 145 10/15/2013 0520   K 4.0 10/15/2013 0520   CL 104 10/15/2013 0520   CO2 26 10/15/2013 0520   GLUCOSE 117* 10/15/2013 0520   BUN 18 10/15/2013 0520   CREATININE 0.78 10/15/2013 0520   CALCIUM 9.0 10/15/2013 0520   GFRNONAA 73* 10/15/2013 0520   GFRAA 84* 10/15/2013 0520    CMP     Component Value Date/Time   NA 145 10/15/2013 0520   K 4.0 10/15/2013 0520    CL 104 10/15/2013 0520   CO2 26 10/15/2013 0520   GLUCOSE 117* 10/15/2013 0520   BUN 18 10/15/2013 0520   CREATININE 0.78 10/15/2013 0520   CALCIUM 9.0 10/15/2013 0520   PROT 7.9 10/11/2013 2248   ALBUMIN 3.8 10/11/2013 2248   AST 32 10/11/2013 2248   ALT 20 10/11/2013 2248   ALKPHOS 77 10/11/2013 2248   BILITOT 0.4 10/11/2013 2248   GFRNONAA 73* 10/15/2013 0520   GFRAA 84* 10/15/2013 0520   10/16/13 Chest Xray Reviewed/Impressions: IMPRESSION:  Bronchitic changes with new atelectasis versus infiltrate in RIGHT  lower lobe.   10/12/13 CT scan of the Head Reviewed/Impressions: No acute intracranial abnormality.  Atrophy, chronic microvascular disease.     Time In Time Out Total Time Spent with Patient Total Overall Time  1015 1035 10 minutes 20 minutes    Greater than 50%  of this time was spent counseling and coordinating care related to the above assessment and plan.  Orvis BrillAaron J. Juliano Mceachin D.O. Palliative Medicine Team at [facility]  Team Phone: [phone]

## 2013-10-17 DIAGNOSIS — Z7189 Other specified counseling: Secondary | ICD-10-CM

## 2013-10-17 DIAGNOSIS — R4020 Unspecified coma: Secondary | ICD-10-CM

## 2013-10-17 DIAGNOSIS — R627 Adult failure to thrive: Secondary | ICD-10-CM

## 2013-10-17 LAB — GLUCOSE, CAPILLARY
GLUCOSE-CAPILLARY: 249 mg/dL — AB (ref 70–99)
GLUCOSE-CAPILLARY: 262 mg/dL — AB (ref 70–99)
Glucose-Capillary: 302 mg/dL — ABNORMAL HIGH (ref 70–99)
Glucose-Capillary: 334 mg/dL — ABNORMAL HIGH (ref 70–99)

## 2013-10-17 MED ORDER — SENNOSIDES 8.8 MG/5ML PO SYRP
5.0000 mL | ORAL_SOLUTION | Freq: Every evening | ORAL | Status: DC | PRN
  Filled 2013-10-17: qty 5

## 2013-10-17 MED ORDER — MORPHINE SULFATE (CONCENTRATE) 10 MG /0.5 ML PO SOLN: 5.0000 mg | ORAL | Status: DC | PRN

## 2013-10-17 MED ORDER — GLYCOPYRROLATE 0.2 MG/ML IJ SOLN
0.1000 mg | Freq: Four times a day (QID) | INTRAMUSCULAR | Status: DC | PRN
  Filled 2013-10-17: qty 0.5

## 2013-10-17 MED ORDER — LORAZEPAM 2 MG/ML IJ SOLN: 0.5000 mg | INTRAMUSCULAR | Status: DC | PRN

## 2013-10-17 MED ORDER — INSULIN ASPART 100 UNIT/ML ~~LOC~~ SOLN
3.0000 [IU] | Freq: Four times a day (QID) | SUBCUTANEOUS | Status: DC
  Administered 2013-10-17: 3 [IU] via SUBCUTANEOUS

## 2013-10-17 MED ORDER — SCOPOLAMINE 1 MG/3DAYS TD PT72
1.0000 | MEDICATED_PATCH | TRANSDERMAL | Status: DC
  Administered 2013-10-17: 1.5 mg via TRANSDERMAL
  Filled 2013-10-17 (×2): qty 1

## 2013-10-17 MED ORDER — BISACODYL 10 MG RE SUPP: 10.0000 mg | Freq: Every day | RECTAL | Status: DC | PRN

## 2013-10-17 MED ORDER — HALOPERIDOL LACTATE 2 MG/ML PO CONC
2.0000 mg | Freq: Four times a day (QID) | ORAL | Status: DC | PRN
  Filled 2013-10-17: qty 1

## 2013-10-17 NOTE — Progress Notes (Signed)
Addendum to progress note After [facility] and family members finished, the decision was to transition to full comfort care and to look for residential hospice placement.  Plan: -will stop labs draws, CBG's and DVT prophylaxis -will discontinue tube feedings and NGT -will stop antibiotics -will start scopolamine and PRN robinul for end of life secretions -PRN roxanol, ativan and haldol for comfort, analgesia and end of life agitation control -social worker contacted for residential hospice placement -will focus on full comfort care   Vassie LollMadera, Antonette Hendricks [phone]

## 2013-10-17 NOTE — Progress Notes (Signed)
[facility] PROGRESS NOTE  Colleen Collins VWU:[id number] DOB: [birth year] DOA: 10/11/2013 PCP: [name], MD  Brief narrative 80 year old female with advanced dementia, mild GERD, chronic kidney disease, foot ulcer, hyperlipidemia, diabetes mellitus, presented to the ED on 7/5 after she was found to be somnolent all day on 10/11/2013 aspirin her husband. He had left a bottle of Klonopin out and patient had access to it around 2 AM on 7/4. She then fell asleep on the couch throughout the day on 7/4 and since she did not wake up he was concerned and called EMS. Patient has advanced dementia but prior to that event did not have any change in her mental status. She possibly overdosed with about 40, 0.5 mg Klonopin tablets. Patient was intubated for respiratory distress. A Foley catheter was placed in. Did not use flumazenil with this of precipitating a seizure and given long acting nature of clonazepam she may require a drip. In the ED a head CT was done please did not show any acute findings except for chronic atrophy. Patient admitted to ICU.  Course in the ICU Patient remained intubated from 7/5-7/7. On 7/7 patient was withdrawn with spontaneous cough. She was successfully extubated and remained stable in the ICU. She was transferred to the floor on 7/8. Patient remained nonverbal and noncommunicative.. All sedatives have been held. Seen by nutrition consult and started on tube feed via NG tube with Jevity 1.2 at 25 mL per hour with increasing cold with a day to 45 mL per hour.   Assessment/Plan: Acute hypoxic respiratory failure with encephalopathy -Secondary to benzodiazepine overdose. Patient has advanced dementia. Patient required intubation from 7/5-7/7 and was successfully extubated. -Maintaining O2 sat on room air. Vitals are stable and patient remains afebrile. -Still has some underlying encephalopathy and unable to follow commands or communicate -continue supportive care  -palliative  care consulted -will follow rec's for GOC decisions and assistance with disposition   Dysphagia with acute encephalopathy -Patient with temporary Tube Feedings through NGT -unable to follow commands for any proper swallowing evaluation   Diabetes mellitus type 2 -Metformin resumed at a lower dose. Continue glyburide and SSI -will add novolog 3 units Q6 hours for tube feeding coverage; patient with hyperglycemia up to 350 range  Advanced dementia and FTT -Patient with poor prognosis; has remained non-communicative and difficult to arouse -high risk for aspiration and so far with none-meaningful recovery -will follow palliative care for assistance determining disposition   RLL infiltrates -most likely secondary to aspiration -has been started on clindamycin -no fever and normal WBC's -will keep antibiotic on board for now, and will follow GOC meeting results and decision/disposition from family mmebers.  Diet: N.p.o. with tube feeds via NG  DVT prophylaxis: Subcutaneous Lovenox.  Code Status: DO NOT RESUSCITATE Family Communication: Discussed with husband over the phone osition Plan: Pending palliative care consult. Patient with poor overall prognosis. Will benefit of residential hospice.  Consultants: PCCM palliative care   Procedures: Intubation on 7/5, extubated on 7/7 Foley catheter in ED   Antibiotics:  None  HPI/Subjective: Poorly arousable; non-communicative or able to follow commands. No fever.  Objective: Filed Vitals:   10/17/13 0945  BP: 137/80  Pulse: 90  Temp: 98.7 F (37.1 C)  Resp: 16    Intake/Output Summary (Last 24 hours) at 10/17/13 1200 Last data filed at 10/17/13 0900  Gross per 24 hour  Intake      0 ml  Output    750 ml  Net   -750  ml   Filed Weights   10/15/13 0600 10/15/13 1956 10/16/13 1954  Weight: 52.8 kg (116 lb 6.5 oz) 52.802 kg (116 lb 6.5 oz) 52.5 kg (115 lb 11.9 oz)    Exam:   General:  Elderly frail female lying  in bed; mainly moaning when arouse; no fever; she is unable to follow commands or talk at this moment.  HEENT: NG in place, no pallor, moist mucosa  Cardiovascular: Normal S1-S2, no murmurs rub or gallop  Respiratory: Coarse breath sounds bilaterally, with positive diffuse rhonchi; no wheezing  Abdomen: Soft, nontender, nondistended, bowel sounds present, foley in place  Musculoskeletal: no edema, no cyanosis   CNS: poorly arousable, not communicative or oriented.   Data Reviewed: Basic Metabolic Panel:  Recent Labs Lab 10/11/13 2248 10/12/13 0453 10/13/13 0237 10/14/13 0446 10/15/13 0520  NA 143 142 142 138 145  K 4.7 4.4 3.4* 3.9 4.0  CL 104 106 107 102 104  CO2 26 22 24 22 26   GLUCOSE 197* 133* 165* 251* 117*  BUN 17 15 17 19 18   CREATININE 0.85 0.74 0.78 0.74 0.78  CALCIUM 9.4 8.9 8.7 8.7 9.0  MG 1.9 1.6  --   --   --   PHOS  --  3.2  --   --   --    Liver Function Tests:  Recent Labs Lab 10/11/13 2248  AST 32  ALT 20  ALKPHOS 77  BILITOT 0.4  PROT 7.9  ALBUMIN 3.8   CBC:  Recent Labs Lab 10/11/13 2248 10/12/13 0453 10/14/13 0446 10/15/13 0520  WBC 5.4 8.7 8.0 5.9  HGB 12.8 13.6 11.0* 12.0  HCT 37.9 40.9 33.1* 35.6*  MCV 99.0 101.0* 98.5 99.7  PLT 215 201 186 205   Cardiac Enzymes:  Recent Labs Lab 10/11/13 2248  CKTOTAL 44   CBG:  Recent Labs Lab 10/16/13 1704 10/16/13 2053 10/17/13 0046 10/17/13 0425 10/17/13 0800  GLUCAP 315* 354* 262* 249* 302*    Recent Results (from the past 240 hour(s))  MRSA PCR SCREENING     Status: None   Collection Time    10/12/13  3:54 AM      Result Value Ref Range Status   MRSA by PCR NEGATIVE  NEGATIVE Final   Comment:            The GeneXpert MRSA Assay (FDA     approved for NASAL specimens     only), is one component of a     comprehensive MRSA colonization     surveillance program. It is not     intended to diagnose MRSA     infection nor to guide or     monitor treatment for      MRSA infections.     Studies: Dg Chest Port 1 View  10/16/2013   CLINICAL DATA:  Shortness of breath, weakness, history aspiration, diabetes  EXAM: PORTABLE CHEST - 1 VIEW  COMPARISON:  Portable exam 0914 hr compared to 10/12/2013  FINDINGS: Feeding tube extends into abdomen.  Normal heart size with minimal pulmonary vascular congestion.  Atherosclerotic calcification aorta.  Atelectasis versus infiltrate RIGHT lower lobe.  Central peribronchial thickening.  Remaining lungs clear.  No pleural effusion or pneumothorax.  IMPRESSION: Bronchitic changes with new atelectasis versus infiltrate in RIGHT lower lobe.   Electronically Signed   By: Ulyses Southward M.D.   On: 10/16/2013 09:32    Scheduled Meds: . antiseptic oral rinse  15 mL Mouth Rinse QID  . chlorhexidine  15 mL Mouth Rinse BID  . clindamycin (CLEOCIN) IV  600 mg Intravenous 3 times per day  . docusate  100 mg Per Tube BID  . enoxaparin (LOVENOX) injection  30 mg Subcutaneous Q24H  . glyBURIDE  5 mg Per Tube Q breakfast  . insulin aspart  0-9 Units Subcutaneous 6 times per day  . metFORMIN  250 mg Oral BID WC   Continuous Infusions: . feeding supplement (JEVITY 1.2 CAL) 1,000 mL (10/16/13 0725)     Time spent: 25 minuites   Vassie LollMadera, Raseel Jans  [facility] Pager [phone]. If 7PM-7AM, please contact night-coverage at [email or web address], password [email or web address] 10/17/2013, 12:00 PM  LOS: 6 days

## 2013-10-17 NOTE — Progress Notes (Addendum)
Inpatient Diabetes Program Recommendations  AACE/ADA: New Consensus Statement on Inpatient Glycemic Control (2013)  Target Ranges:  Prepandial:   less than 140 mg/dL      Peak postprandial:   less than 180 mg/dL (1-2 hours)      Critically ill patients:  140 - 180 mg/dL     Results for Colleen Collins, Colleen Collins (MRN [id number]) as of 10/17/2013 07:05  Ref. Range 10/15/2013 23:57 10/16/2013 04:23 10/16/2013 07:32 10/16/2013 11:24 10/16/2013 17:04 10/16/2013 20:53  Glucose-Capillary Latest Range: 70-99 mg/dL 782234 (H) 956214 (H) 213186 (H) 293 (H) 315 (H) 354 (H)     **Note family meeting today with Palliative care for Goals of Care meeting  **Patient currently receiving Jevity tube feeds at 45 cc/hour.  Having significant glucose elevations.    MD- If within goals of care, please consider the following:  1. Add Novolog tube feed coverage- Novolog 3 units Q4 hours (hold if tube feeds held for any reason) 2. Continue Novolog Sensitive SSI Q4 hours     Will follow Ambrose FinlandJeannine Johnston Nicolae Vasek RN, MSN, CDE Diabetes Coordinator Inpatient Diabetes Program Team Pager: [phone] (8a-10p)

## 2013-10-17 NOTE — Progress Notes (Signed)
Patient Colleen Collins      DOB: Mar 27, 1923      MOQ:[id number]   Palliative Medicine Team at [facility] Progress Note    Subjective: Nonverbal. Opens eyes to husbands voice.  Family meeting today with husband and godson as noted below.  Patient unabelt o provide any ROS 2/2 cognitive impairment.     Filed Vitals:   10/17/13 0945  BP: 137/80  Pulse: 90  Temp: 98.7 F (37.1 C)  Resp: 16   Physical exam: GEN: opens eyes to voice occassionally.  Smiled for husband HEENT: [place], mmm 2/2 excellent nursing care' CV: RRR LUNGS: Scattered coarse breath sounds, symm expan ABD: Soft, ND EXT: no c/c SKIN: warm/dry     Assessment/Recomendations:  80 yo female with sever dementia, DM who presented with respiratory failure after presumed accidental benzodiazepine overdose requiring intubation/mechanical ventilation. Current hospital course complicated by prolonged encephalopathy. Palliative Care consulted for goals of care.   1. Code Status: DNR   2. Goals of Care: Met with husband Colleen Collins Kitchen and Colleen Collins today.  Discussed overall goals of care. They report that she had been doing poorly for at least 3 weeks prior to accidental ingestion. Having more confusion, weaker and more difficulty with ambulating.  They have also not seen much recovery since her ICU stay and post-extubation. Husband reports that they have discussed situations like this in the past (married over 66yr) and given her prolonged decline 2/2 dementia and decreasing likelihood for significant neurologic recovery they would like to proceed with comfort care. This includes discontinuing DHT and tube feeds as well as antibiotics.  Suspect prognosis could be in weeks though also high risk for decline and terminal event in coming days.  They are interested in pursuing hospice care  3. Symptom Management:  1. Dysphagia/FTT- discontinue DHT 2. Comfort medications- Will prescribe PRN haldol for any terminal agitation. PRN morphine for  any pain/dyspnea. Robinul PRN for secretions.   4.   4. Psychosocial: Lives with husband Colleen Collins Also receives assistance from Colleen Collins discussed with case management. Family feels they are unable to care for her at home any longer.  [facility]                   .         40 minutes total time with >50% spent in counseling and coordination of care regarding above plan.      ADoran ClayD.O. [facility]iative [facility] [phone]      5.

## 2013-10-18 DIAGNOSIS — T6591XS Toxic effect of unspecified substance, accidental (unintentional), sequela: Secondary | ICD-10-CM

## 2013-10-18 DIAGNOSIS — T50901S Poisoning by unspecified drugs, medicaments and biological substances, accidental (unintentional), sequela: Secondary | ICD-10-CM

## 2013-10-18 DIAGNOSIS — R1310 Dysphagia, unspecified: Secondary | ICD-10-CM

## 2013-10-18 MED ORDER — WHITE PETROLATUM GEL
Status: AC
  Administered 2013-10-18: 11:00:00
  Filled 2013-10-18: qty 5

## 2013-10-18 MED ORDER — BISACODYL 10 MG RE SUPP
10.0000 mg | Freq: Every day | RECTAL | Status: DC
  Administered 2013-10-18 – 2013-10-19 (×2): 10 mg via RECTAL
  Filled 2013-10-18 (×2): qty 1

## 2013-10-18 MED ORDER — DOCUSATE SODIUM 283 MG RE ENEM: 1.0000 | ENEMA | Freq: Every day | RECTAL | Status: DC | PRN

## 2013-10-18 NOTE — Progress Notes (Signed)
Patient UC:JARW Mainwaring      DOB: [birth year]      PTY:[id number]   Palliative Medicine Team at [facility] Progress Note    Subjective: Alert to voice this morning. Denies pain. Able to interact more today. Unreliable ROS 2/2 cognitive impairment. Spoke with husband via phone as well.     Filed Vitals:   10/18/13 0915  BP: 115/96  Pulse: 87  Temp: 98.3 F (36.8 C)  Resp: 18   Physical exam: GEN: more alert and interactive. Able to provide yes/no answers to some basic questions. Not oriented to person/place/time HEENT: [place], mmm CV: RRR LUNGS: CTAB anteriorly, symm expansion ABD: soft, NT ND EXT: no c/c/e    Assessment/Recomendations:  80 yo female with severe dementia, DM who presented with respiratory failure after presumed accidental benzodiazepine overdose requiring intubation/mechanical ventilation. Current hospital course complicated by prolonged encephalopathy. Palliative Care consulted for goals of care.   1. Code Status: DNR   2. Goals of Care: Met with husband Marland Kitchen and Luvenia Redden. Discussed overall goals of care. See previous notes for discussion.  In summary, they have elected to proceed with comfort care. This includes discontinuing DHT and tube feeds as well as antibiotics. They are interested in pursuing hospice care. Addressed husband concern about nutrition/starvation in terminal dementia.  He is in agreement with allowing comfort feeds and not pursuing artificial nutrition if she is unable to eat.  We also discussed her being more alert today.  Regardless, focus stays same with idea of comfort care.  They still wish to proceed with hospice placement GIP if available. Difficulty in caring for at home.   If she were to no longer meet general inpatient hospice criteria, then residential hospice could be pursued.   3. Symptom Management:  1. Dysphagia/FTT- allow comfort feeds (I have written diet order), continue good oral care.  2. Comfort medications- Will prescribe  PRN haldol for any terminal agitation. PRN morphine for any pain/dyspnea. Robinul PRN for secretions.   4. 4. Psychosocial: Lives with husband Koraima Albertsen. Also receives assistance from [employer]- discussed with case management. Family feels they are unable to care for her at home any longer. Would like to look at [facility] hospice vs residential hospice home.

## 2013-10-18 NOTE — Progress Notes (Signed)
Foam dressing placed on sacrum and bilateral heels.   Leanna BattlesEckelmann, Ayaansh Smail Eileen, RN.

## 2013-10-18 NOTE — Progress Notes (Signed)
[facility] PROGRESS NOTE  Amery Vandenbos EAV:[id number] DOB: [birth year] DOA: 10/11/2013 PCP: [name], MD  Brief narrative  80 year old female with advanced dementia, mild GERD, chronic kidney disease, foot ulcer, hyperlipidemia, diabetes mellitus, presented to the ED on 7/5 after she was found to be somnolent all day on 10/11/2013 aspirin her husband. He had left a bottle of Klonopin out and patient had access to it around 2 AM on 7/4. She then fell asleep on the couch throughout the day on 7/4 and since she did not wake up he was concerned and called EMS. Patient has advanced dementia but prior to that event did not have any change in her mental status. She possibly overdosed with about 40, 0.5 mg Klonopin tablets. Patient was intubated for respiratory distress. A Foley catheter was placed in. Did not use flumazenil with this of precipitating a seizure and given long acting nature of clonazepam she may require a drip.  In the ED a head CT was done please did not show any acute findings except for chronic atrophy. Patient admitted to ICU.   Course in the ICU  Patient remained intubated from 7/5-7/7. On 7/7 patient was withdrawn with spontaneous cough. She was successfully extubated and remained stable in the ICU. She was transferred to the floor on 7/8. Patient remained nonverbal and noncommunicative.. All sedatives have been held. Seen by nutrition consult and started on tube feed via NG tube with Jevity 1.2 at 25 mL per hour with increasing cold with a day to 45 mL per hour.   Assessment/Plan:   Acute hypoxic respiratory failure with encephalopathy  -Secondary to benzodiazepine overdose. Patient has advanced dementia. Patient required intubation from 7/5-7/7 and was successfully extubated.  -Maintaining O2 sat on room air. Vitals are stable and patient remains afebrile.  -Still has some underlying encephalopathy and unable to follow commands or communicate  -continue supportive care   -palliative care consulted and planning to transition to full hospice, GIP -  Morphine as needed for shortness of breath  Dysphagia with acute encephalopathy  - Appreciate palliative care assistance  -  Continue like a purely, scopolamine  Diabetes mellitus type 2, full comfort measures -  Hold oral medications -  No CBG  Advanced dementia and FTT, Patient with poor prognosis; has remained non-communicative and difficult to arouse  -high risk for aspiration and so far with none-meaningful recovery  - Transition to inpatient hospice when bed available  RLL infiltrates  -most likely secondary to aspiration  - abx discontinued per palliative care  Constipation,  -  Start suppositories and prn enemas  Diet: N.p.o. Unless awake enough to tolerate palliative diet  Diet:  NPO unless she awakes enough to tolerate a palliative diet Access:  PIV IVF:  Off Proph:  SCDs  Code Status: DO NOT RESUSCITATE Family Communication: spoke with husband Disposition Plan: Transition to inpatient hospice when bed available   Consultants:  PCCM  palliative care  Procedures:  Intubation on 7/5, extubated on 7/7  Foley catheter in ED  Antibiotics:  None   HPI/Subjective:  Nonverbal  Objective: Filed Vitals:   10/17/13 0604 10/17/13 0945 10/17/13 2104 10/18/13 0456  BP: 145/73 137/80 132/67 135/76  Pulse: 89 90 90 93  Temp: 98.8 F (37.1 C) 98.7 F (37.1 C) 98.6 F (37 C) 98.2 F (36.8 C)  TempSrc:  Oral Oral Oral  Resp: 13 16 16 16   Height:      Weight:   52.501 kg (115 lb 11.9 oz)  SpO2: 95% 92% 95% 98%    Intake/Output Summary (Last 24 hours) at 10/18/13 0823 Last data filed at 10/18/13 0506  Gross per 24 hour  Intake      0 ml  Output    950 ml  Net   -950 ml   Filed Weights   10/15/13 1956 10/16/13 1954 10/17/13 2104  Weight: 52.802 kg (116 lb 6.5 oz) 52.5 kg (115 lb 11.9 oz) 52.501 kg (115 lb 11.9 oz)    Exam:   General:  Thin CF, No acute  distress  HEENT:  NCAT, MMM  Cardiovascular:  RRR, nl S1, S2 no mrg, 2+ pulses, warm extremities  Respiratory:  CTAB, no increased WOB  Abdomen:   NABS, soft, ND, full feeling, NT  MSK:   Normal tone and bulk, no LEE  Neuro:  Nonverbal, lying comfortably in bed  Data Reviewed: Basic Metabolic Panel:  Recent Labs Lab 10/11/13 2248 10/12/13 0453 10/13/13 0237 10/14/13 0446 10/15/13 0520  NA 143 142 142 138 145  K 4.7 4.4 3.4* 3.9 4.0  CL 104 106 107 102 104  CO2 26 22 24 22 26   GLUCOSE 197* 133* 165* 251* 117*  BUN 17 15 17 19 18   CREATININE 0.85 0.74 0.78 0.74 0.78  CALCIUM 9.4 8.9 8.7 8.7 9.0  MG 1.9 1.6  --   --   --   PHOS  --  3.2  --   --   --    Liver Function Tests:  Recent Labs Lab 10/11/13 2248  AST 32  ALT 20  ALKPHOS 77  BILITOT 0.4  PROT 7.9  ALBUMIN 3.8   No results found for this basename: LIPASE, AMYLASE,  in the last 168 hours No results found for this basename: AMMONIA,  in the last 168 hours CBC:  Recent Labs Lab 10/11/13 2248 10/12/13 0453 10/14/13 0446 10/15/13 0520  WBC 5.4 8.7 8.0 5.9  HGB 12.8 13.6 11.0* 12.0  HCT 37.9 40.9 33.1* 35.6*  MCV 99.0 101.0* 98.5 99.7  PLT 215 201 186 205   Cardiac Enzymes:  Recent Labs Lab 10/11/13 2248  CKTOTAL 44   BNP (last 3 results) No results found for this basename: PROBNP,  in the last 8760 hours CBG:  Recent Labs Lab 10/16/13 2053 10/17/13 0046 10/17/13 0425 10/17/13 0800 10/17/13 1200  GLUCAP 354* 262* 249* 302* 334*    Recent Results (from the past 240 hour(s))  MRSA PCR SCREENING     Status: None   Collection Time    10/12/13  3:54 AM      Result Value Ref Range Status   MRSA by PCR NEGATIVE  NEGATIVE Final   Comment:            The GeneXpert MRSA Assay (FDA     approved for NASAL specimens     only), is one component of a     comprehensive MRSA colonization     surveillance program. It is not     intended to diagnose MRSA     infection nor to guide or      monitor treatment for     MRSA infections.     Studies: Dg Chest Port 1 View  10/16/2013   CLINICAL DATA:  Shortness of breath, weakness, history aspiration, diabetes  EXAM: PORTABLE CHEST - 1 VIEW  COMPARISON:  Portable exam 0914 hr compared to 10/12/2013  FINDINGS: Feeding tube extends into abdomen.  Normal heart size with minimal pulmonary vascular congestion.  Atherosclerotic  calcification aorta.  Atelectasis versus infiltrate RIGHT lower lobe.  Central peribronchial thickening.  Remaining lungs clear.  No pleural effusion or pneumothorax.  IMPRESSION: Bronchitic changes with new atelectasis versus infiltrate in RIGHT lower lobe.   Electronically Signed   By: Ulyses SouthwardMark  Boles M.D.   On: 10/16/2013 09:32    Scheduled Meds: . antiseptic oral rinse  15 mL Mouth Rinse QID  . chlorhexidine  15 mL Mouth Rinse BID  . scopolamine  1 patch Transdermal Q72H  . white petrolatum       Continuous Infusions:   Active Problems:   Altered mental status   Respiratory failure   Benzodiazepine (tranquilizer) overdose   Diabetes    Time spent: 30 min    Donie Moulton, [facility]  [facility] Pager [phone]. If 7PM-7AM, please contact night-coverage at [email or web address], password [email or web address] 10/18/2013, 8:23 AM  LOS: 7 days

## 2013-10-19 LAB — GLUCOSE, CAPILLARY: Glucose-Capillary: 161 mg/dL — ABNORMAL HIGH (ref 70–99)

## 2013-10-19 NOTE — Clinical Social Work Psychosocial (Signed)
Clinical Social Work Department BRIEF PSYCHOSOCIAL ASSESSMENT 10/19/2013  Patient:  Colleen Collins,Anaria     Account Number:  [id number]     Admit date:  10/11/2013  Clinical Social Worker:  Earnestine LeysPatrick-jefferson,Daijanae Rafalski, LCSWA  Date/Time:  10/19/2013 03:47 PM  Referred by:  Physician  Date Referred:  10/19/2013 Referred for  Residential hospice placement   Other Referral:   Interview type:  Family Other interview type:   CSW spoke with patient's husband Sharlet SalinaBenjamin and son Christ via telephone.    PSYCHOSOCIAL DATA Living Status:  HUSBAND Admitted from facility:   Level of care:   Primary support name:  Gerrie NordmannBenjamin Steppe [phone]) Primary support relationship to patient:  SPOUSE Degree of support available:   Good. Patient also has son Thayer Ohm(Chris) [phone]    CURRENT CONCERNS Current Concerns  Post-Acute Placement   Other Concerns:    SOCIAL WORK ASSESSMENT / PLAN CSW contacted patient's husband and son via telephone. CSW introduced self and explained role. CSW explained hospice process and discussed d/c plan with husband and son. Per [facility] and son, [facility]. Son stated he and father are to have a clearer plan about hospice tomorrow and are agreeable to residential hospice.   Assessment/plan status:  [facility] Other assessment/ plan:   Information/referral to community resources:   CSW to make [facility].    PATIENT'S/FAMILY'S RESPONSE TO PLAN OF CARE: Patient's husband and son were both cooperative and thanked CSW for assistance.   Torrell Krutz Patrick-Jefferson, LCSWA Weekend Clinical Social Worker [phone]

## 2013-10-19 NOTE — Plan of Care (Signed)
Problem: Phase I Progression Outcomes Goal: Voiding-avoid urinary catheter unless indicated Outcome: Not Progressing Foley catheter for end of life care.

## 2013-10-19 NOTE — Progress Notes (Signed)
Patient ZO:XWRU:Colleen Collins      DOB: Jan 19, 1923      EAV:[id number]:[id number]   [facility]    Subjective: Non verbal. Shook head yes to being hungry.     Filed Vitals:   10/19/13 0935  BP: 135/72  Pulse: 87  Temp: 98.2 F (36.8 C)  Resp: 16   Physical exam:  GEN: Appears comfortable. Smiles when I wake her.  Inconsistently answers questions HEENT: [place], dry mm CV: Regular     Assessment/Recomendations:  80 yo female with severe dementia, DM who presented with respiratory failure after presumed accidental benzodiazepine overdose requiring intubation/mechanical ventilation. Current hospital course complicated by prolonged encephalopathy. Palliative Care consulted for goals of care.   1. Code Status: DNR   2. Goals of Care:  See previous notes for discussion. In summary, they have elected to proceed with comfort care. This includes discontinuing DHT and tube feeds as well as antibiotics. They are interested in pursuing hospice care. Addressed husband concern about nutrition/starvation in terminal dementia. He is in agreement with allowing comfort feeds and not pursuing artificial nutrition if she is unable to eat. We also discussed her being more alert today. Regardless, focus stays same with idea of comfort care. They still wish to proceed with hospice placement GIP if available. Difficulty in caring for at home. If she were to no longer meet general inpatient hospice criteria, then residential hospice could be pursued.  - today attempted to allow Taheerah to feed/drink but she was unable to participate. She did shake her head yes when I asked if hungry or thirsty, but could not attempt swallow.   3. Symptom Management:  1. Dysphagia/FTT- allow comfort feeds as able (would not swallow for me today), continue good oral care.  2. Comfort medications- Continue PRN haldol for any terminal agitation. PRN morphine for any pain/dyspnea. Robinul PRN for secretions.  No PRN's needed thus far  4. Psychosocial: Lives with husband Gerrie NordmannBenjamin Bouler. Also receives assistance from [employer]son Chris Gleeson   5. Dispo- discussed with case management. Family feels they are unable to care for her at home any longer. [facility].    Orvis BrillAaron J. Quiana Cobaugh D.O. [facility]  Team Phone: [phone]

## 2013-10-19 NOTE — Progress Notes (Addendum)
[facility] PROGRESS NOTE  Colleen SandhoffLucy Collins ZOX:[id number] DOB: 1922-06-27 DOA: 10/11/2013 PCP: Kristian CoveyBURCHETTE,BRUCE W, MD  Brief narrative  80 year old female with advanced dementia, mild GERD, chronic kidney disease, foot ulcer, hyperlipidemia, diabetes mellitus, presented to the ED on 7/5 after she was found to be somnolent all day on 10/11/2013 aspirin her husband. He had left a bottle of Klonopin out and patient had access to it around 2 AM on 7/4. She then fell asleep on the couch throughout the day on 7/4 and since she did not wake up he was concerned and called EMS. Patient has advanced dementia but prior to that event did not have any change in her mental status. She possibly overdosed with about 40, 0.5 mg Klonopin tablets. Patient was intubated for respiratory distress. A Foley catheter was placed in. Did not use flumazenil with this of precipitating a seizure and given long acting nature of clonazepam she may require a drip.  In the ED a head CT was done please did not show any acute findings except for chronic atrophy. Patient admitted to ICU.   Patient remained intubated from 7/5-7/7. On 7/7 patient was withdrawn with spontaneous cough. She was successfully extubated and remained stable in the ICU. She was transferred to the floor on 7/8. Patient remained nonverbal and noncommunicative.. All sedatives have been held. Seen by nutrition consult and started on tube feed via NG tube.    In conversation with palliative care, NG has been removed and she has been transitioned to comfort measures, palliative feeding.  She is awaiting transfer to inpt hospice or GIP on Monday.    Assessment/Plan:   Acute hypoxic respiratory failure with encephalopathy secondary to unintentional benzodiazepine overdose, resolving. -  Currently on RA -  Starting to try to communicate some today  Advanced dementia and FTT, Patient with poor prognosis, but awaking some today.   - Transition to inpatient hospice vs. SNF  with hospice depending on palliative care recommendations - Continue foley for comfort  Dysphagia with acute encephalopathy  -  Continue scopolamine -  Comfort diet  Diabetes mellitus type 2, full comfort measures.  No oral medications or CBG  Probable aspiration pneumonia with RLL infiltrates, rhonchorous lung sounds on exam - abx discontinued per palliative care  Constipation, resolved after starting suppositories and prn enemas  Diet:  Palliative diet Access:  PIV IVF:  Off Proph:  SCDs  Code Status: DO NOT RESUSCITATE Family Communication: spoke with husband Disposition Plan: Transition to inpatient hospice vs. SNF with hospice on Monday   Consultants:  PCCM  palliative care  Procedures:  Intubation on 7/5, extubated on 7/7  Foley catheter in ED  Antibiotics:  None   HPI/Subjective:  Smiled and tried to talk to me today, answers did not make sense nor was she able to follow commands  Objective: Filed Vitals:   10/18/13 0915 10/18/13 1804 10/18/13 2041 10/19/13 0534  BP: 115/96 138/82 134/81 128/74  Pulse: 87 64 87 82  Temp: 98.3 F (36.8 C) 98.2 F (36.8 C) 98.1 F (36.7 C) 98.5 F (36.9 C)  TempSrc: Oral Oral Oral Oral  Resp: 18 18 16 15   Height:      Weight:   52.501 kg (115 lb 11.9 oz)   SpO2: 94% 98% 98% 94%    Intake/Output Summary (Last 24 hours) at 10/19/13 0742 Last data filed at 10/19/13 0539  Gross per 24 hour  Intake      0 ml  Output    950 ml  Net   -950 ml   Filed Weights   10/16/13 1954 10/17/13 2104 10/18/13 2041  Weight: 52.5 kg (115 lb 11.9 oz) 52.501 kg (115 lb 11.9 oz) 52.501 kg (115 lb 11.9 oz)    Exam:   General:  Thin CF, No acute distress  HEENT:  NCAT, MMM  Cardiovascular:  RRR with premature beats, nl S1, S2, 2/6 systolic mumur at LSB, 2+ pulses, warm extremities  Respiratory:  Rhonchi, no focal rales or wheeze anteriorly, no increased WOB  Abdomen:   NABS, soft, ND, full feeling, NT  MSK:   Normal tone and  bulk, no LEE  Neuro:  Nonverbal, lying comfortably in bed  Data Reviewed: Basic Metabolic Panel:  Recent Labs Lab 10/13/13 0237 10/14/13 0446 10/15/13 0520  NA 142 138 145  K 3.4* 3.9 4.0  CL 107 102 104  CO2 24 22 26   GLUCOSE 165* 251* 117*  BUN 17 19 18   CREATININE 0.78 0.74 0.78  CALCIUM 8.7 8.7 9.0   Liver Function Tests: No results found for this basename: AST, ALT, ALKPHOS, BILITOT, PROT, ALBUMIN,  in the last 168 hours No results found for this basename: LIPASE, AMYLASE,  in the last 168 hours No results found for this basename: AMMONIA,  in the last 168 hours CBC:  Recent Labs Lab 10/14/13 0446 10/15/13 0520  WBC 8.0 5.9  HGB 11.0* 12.0  HCT 33.1* 35.6*  MCV 98.5 99.7  PLT 186 205   Cardiac Enzymes: No results found for this basename: CKTOTAL, CKMB, CKMBINDEX, TROPONINI,  in the last 168 hours BNP (last 3 results) No results found for this basename: PROBNP,  in the last 8760 hours CBG:  Recent Labs Lab 10/16/13 2053 10/17/13 0046 10/17/13 0425 10/17/13 0800 10/17/13 1200  GLUCAP 354* 262* 249* 302* 334*    Recent Results (from the past 240 hour(s))  MRSA PCR SCREENING     Status: None   Collection Time    10/12/13  3:54 AM      Result Value Ref Range Status   MRSA by PCR NEGATIVE  NEGATIVE Final   Comment:            The GeneXpert MRSA Assay (FDA     approved for NASAL specimens     only), is one component of a     comprehensive MRSA colonization     surveillance program. It is not     intended to diagnose MRSA     infection nor to guide or     monitor treatment for     MRSA infections.     Studies: No results found.  Scheduled Meds: . antiseptic oral rinse  15 mL Mouth Rinse QID  . bisacodyl  10 mg Rectal Daily  . chlorhexidine  15 mL Mouth Rinse BID  . scopolamine  1 patch Transdermal Q72H   Continuous Infusions:   Active Problems:   Altered mental status   Respiratory failure   Benzodiazepine (tranquilizer) overdose    Diabetes    Time spent: 30 min    Mikella Linsley, [facility]  [facility] Pager [phone]. If 7PM-7AM, please contact night-coverage at [email or web address], password [facility] 10/19/2013, 7:42 AM  LOS: 8 days

## 2013-10-19 NOTE — Clinical Social Work Note (Signed)
CSW received consult for residential hospice placement. CSW spoke with patient's RN Cybill, who reported patient may transition to GIP or residential hospice. CSW to follow-up with patient's family.   Janiel Derhammer Patrick-Jefferson, LCSWA Weekend Clinical Social Worker [phone]

## 2013-10-20 MED ORDER — SCOPOLAMINE 1 MG/3DAYS TD PT72: 1.0000 | MEDICATED_PATCH | TRANSDERMAL | Status: AC

## 2013-10-20 MED ORDER — CHLORHEXIDINE GLUCONATE 0.12 % MT SOLN: 15.0000 mL | Freq: Two times a day (BID) | OROMUCOSAL | Status: AC

## 2013-10-20 MED ORDER — DOCUSATE SODIUM 283 MG RE ENEM: 1.0000 | ENEMA | Freq: Every day | RECTAL | Status: AC | PRN

## 2013-10-20 MED ORDER — GLYCOPYRROLATE 0.2 MG/ML IJ SOLN: 0.1000 mg | Freq: Four times a day (QID) | INTRAMUSCULAR | Status: AC | PRN

## 2013-10-20 MED ORDER — BISACODYL 10 MG RE SUPP: 10.0000 mg | Freq: Every day | RECTAL | Status: AC

## 2013-10-20 MED ORDER — MORPHINE SULFATE (CONCENTRATE) 10 MG /0.5 ML PO SOLN: 5.0000 mg | ORAL | Status: AC | PRN

## 2013-10-20 MED ORDER — BIOTENE DRY MOUTH MT LIQD: 15.0000 mL | Freq: Four times a day (QID) | OROMUCOSAL | Status: AC

## 2013-10-20 MED ORDER — HALOPERIDOL LACTATE 2 MG/ML PO CONC: 2.0000 mg | Freq: Four times a day (QID) | ORAL | Status: AC | PRN

## 2013-10-20 NOTE — Progress Notes (Signed)
Called [facility] to give report.

## 2013-10-20 NOTE — Progress Notes (Signed)
Patient Discharge:  [facility]  Education: Packet sent to facility, family educated on d/c instructions  IV: Discontinued  Transportation: Ambulance  Belongings: [employer]

## 2013-10-20 NOTE — Progress Notes (Signed)
[facility] PROGRESS NOTE  Colleen Collins WUJ:[id number] DOB: 1923-01-30 DOA: 10/11/2013 PCP: [name], MD  Brief narrative: 80 year old female with advanced dementia, mild GERD, chronic kidney disease, foot ulcer, hyperlipidemia, diabetes mellitus, presented to the ED on 7/5 after she was found to be somnolent all day on 10/11/2013 according to her husband. He had left a bottle of Klonopin out and patient had access to it around 2 AM on 7/4. She possibly overdosed with about 40, 0.5 mg Klonopin tablets. Patient was intubated for respiratory distress. A Foley catheter was placed. In the ED a head CT was done and did not show any acute findings except for chronic atrophy. Patient admitted to ICU.   Patient remained intubated from 7/5-7/7. On 7/7 patient was withdrawn with spontaneous cough. She was successfully extubated and remained stable in the ICU. She was transferred to the floor on 7/8. Patient remained nonverbal and noncommunicative and all sedatives were held.  She has slowly had an improvement in her mental status and is now awake and will answer some questions.  She is oriented to person only.   In conversation with palliative care, NG has been removed and she has been transitioned to comfort measures, palliative feeding. She is awaiting transfer to inpt hospice.  Assessment/Plan Acute encephalopathy with hypoxic respiratory failure due to unintentional BZD overdose - Currently somewhat improved, awake, makes eye contact, answers some questions appropriately - on room air  Advanced dementia with failture to thrive - Plan to transition to inpatient hospice, comfort feeding, NGT d/c'd - Foley for comfort - Continue scopolamine - PRN haldol, morphine, robinul  Diabetes type 2 - No further testing or oral medications due to full comfort care   Dysphagia with likely aspiration pneumonia - Vital signs are stable, Abx discontinued due to transition to full comfort measures.      Consultants:  PC  Procedures/Studies:  No results found.   Antibiotics:  none  Code Status: DNR Family Communication: Pt at bedside Disposition Plan: Inpatient hospice DVT PPx: SCDs  HPI/Subjective: No events overnight.  Patient awake, answers some questions, does not follow commands consistently.   Objective: Filed Vitals:   10/19/13 0534 10/19/13 0935 10/19/13 2055 10/20/13 0527  BP: 128/74 135/72 150/63 146/72  Pulse: 82 87 78 71  Temp: 98.5 F (36.9 C) 98.2 F (36.8 C) 98.3 F (36.8 C) 98.4 F (36.9 C)  TempSrc: Oral Oral Oral Oral  Resp: 15 16 18 18   Height:      Weight:   114 lb 13.8 oz (52.1 kg)   SpO2: 94% 96% 94% 96%    Intake/Output Summary (Last 24 hours) at 10/20/13 0820 Last data filed at 10/20/13 0700  Gross per 24 hour  Intake    100 ml  Output    930 ml  Net   -830 ml    Exam:   General:  Thin, Pt is alert, answers that her first name is Colleen Collins, does not follow commands consistently.   Cardiovascular: NR, RR, 2/6 systolic murmur  Respiratory: Course breath sounds throughout, but no distress.  No increased work of breathing, no wheezing.   Abdomen: Soft, non tender, non distended, bowel sounds present  Extremities: No edema  Neuro:  lying comfortably in bed, mostly non verbal.   Data Reviewed: Basic Metabolic Panel:  Recent Labs Lab 10/14/13 0446 10/15/13 0520  NA 138 145  K 3.9 4.0  CL 102 104  CO2 22 26  GLUCOSE 251* 117*  BUN 19 18  CREATININE  0.74 0.78  CALCIUM 8.7 9.0  CBC:  Recent Labs Lab 10/14/13 0446 10/15/13 0520  WBC 8.0 5.9  HGB 11.0* 12.0  HCT 33.1* 35.6*  MCV 98.5 99.7  PLT 186 205  CBG:  Recent Labs Lab 10/17/13 0046 10/17/13 0425 10/17/13 0800 10/17/13 1200 10/19/13 2107  GLUCAP 262* 249* 302* 334* 161*    Recent Results (from the past 240 hour(s))  MRSA PCR SCREENING     Status: None   Collection Time    10/12/13  3:54 AM      Result Value Ref Range Status   MRSA by PCR NEGATIVE   NEGATIVE Final   Comment:            The GeneXpert MRSA Assay (FDA     approved for NASAL specimens     only), is one component of a     comprehensive MRSA colonization     surveillance program. It is not     intended to diagnose MRSA     infection nor to guide or     monitor treatment for     MRSA infections.     Scheduled Meds: . antiseptic oral rinse  15 mL Mouth Rinse QID  . bisacodyl  10 mg Rectal Daily  . chlorhexidine  15 mL Mouth Rinse BID  . scopolamine  1 patch Transdermal Q72H   Continuous Infusions:    Debe CoderMULLEN, EMILY, MD  [facility] Pager [phone]  If 7PM-7AM, please contact night-coverage [email or web address] Password [email or web address] 10/20/2013, 8:20 AM   LOS: 9 days

## 2013-10-20 NOTE — Progress Notes (Signed)
CSW spoke to Forrestine HimEva Davis, [facility]- [facility]. [facility] and is awaiting call back. She will follow up with CSW as soon as possible.  Lorri Frederickonna T. West PughCrowder, LCSWA  [phone]

## 2013-10-20 NOTE — Discharge Summary (Signed)
Physician Discharge Summary  Colleen Collins BMW:[id number] DOB: [birth year] DOA: 10/11/2013  PCP: [name], MD  Admit date: 10/11/2013 Discharge date: 10/20/2013  Recommendations for Outpatient Follow-up:  1. Patient will be d/c'd on palliative care, hospice 2. She will need good oral care, comfort feeding and PRN medications as ordered for pain, secretions, etc.   Discharge Diagnoses:  Acute encephalopathy Acute hypoxic Respiratory failure Unintentional Benzodiazepine (tranquilizer) overdose Diabetes Advanced dementia, FTT Dysphagia  Discharge Condition: Stable  Diet recommendation: Comfort diet only  History of present illness:  80 year old female with advanced dementia, mild GERD, chronic kidney disease, foot ulcer, hyperlipidemia, diabetes mellitus, presented to the ED on 7/5 after she was found to be somnolent all day on 10/11/2013 according to her husband. He had left a bottle of Klonopin out and patient had access to it around 2 AM on 7/4. She possibly overdosed with about 40, 0.5 mg Klonopin tablets. Patient was intubated for respiratory distress. A Foley catheter was placed. In the ED a head CT was done and did not show any acute findings except for chronic atrophy. Patient admitted to ICU.  Patient remained intubated from 7/5-7/7. On 7/7 patient was withdrawn with spontaneous cough. She was successfully extubated and remained stable in the ICU. She was transferred to the floor on 7/8. Patient remained nonverbal and noncommunicative and all sedatives were held. She has slowly had an improvement in her mental status and is now awake and will answer some questions. She is oriented to person only.   In conversation with palliative care, NG was removed and she has been transitioned to comfort measures, palliative feeding.    Hospital Course:  Acute encephalopathy with Acute hypoxic Respiratory failure: Likely due to Unintentional Benzodiazepine (tranquilizer) overdose and dementia.   She was initially required intubation and treatment in the ICU. She was extubated on 7/7 and has slowly improved.  She is still mostly nonverbal and answering only some questions, but she is breathing well on RA and has no distress.  She is noted to have dysphagia and possibly an aspiration pneumonia, but due to her transition to hospice care, this will not be further investigated at this time.   Advanced dementia, FTT: Patient has been transitioned to hospice with comfort feeding, foley for comfort.  Scopolamine, prn haldol, morphine and robinul on her medication list.  Oral care with biotene and peridex.   Dysphagia with possibly aspiration pneumonia: Vital signs have remained stable and she is breathing well on room air, no further abx or investigation at this time  Diabetes: No further testing or oral medications.    Procedures/Studies: Ct Head Wo Contrast  10/12/2013   CLINICAL DATA:  Drug overdose.  Dementia, Alzheimer's  EXAM: CT HEAD WITHOUT CONTRAST  TECHNIQUE: Contiguous axial images were obtained from the base of the skull through the vertex without intravenous contrast.  COMPARISON:  07/02/2011  FINDINGS: There is atrophy and chronic small vessel disease changes. No acute intracranial abnormality. Specifically, no hemorrhage, hydrocephalus, mass lesion, acute infarction, or significant intracranial injury. No acute calvarial abnormality.  IMPRESSION: No acute intracranial abnormality.  Atrophy, chronic microvascular disease.   Electronically Signed   By: Charlett Nose M.D.   On: 10/12/2013 02:58   Dg Chest Port 1 View  10/16/2013   CLINICAL DATA:  Shortness of breath, weakness, history aspiration, diabetes  EXAM: PORTABLE CHEST - 1 VIEW  COMPARISON:  Portable exam 0914 hr compared to 10/12/2013  FINDINGS: Feeding tube extends into abdomen.  Normal heart size with  minimal pulmonary vascular congestion.  Atherosclerotic calcification aorta.  Atelectasis versus infiltrate RIGHT lower lobe.   Central peribronchial thickening.  Remaining lungs clear.  No pleural effusion or pneumothorax.  IMPRESSION: Bronchitic changes with new atelectasis versus infiltrate in RIGHT lower lobe.   Electronically Signed   By: Ulyses Southward M.D.   On: 10/16/2013 09:32   Dg Chest Portable 1 View  10/12/2013   CLINICAL DATA:  Endotracheal tube placed  EXAM: PORTABLE CHEST - 1 VIEW  COMPARISON:  Chest x-ray from yesterday  FINDINGS: New endotracheal tube, tip at the level of the mid thoracic trachea. A new orogastric tube enters the stomach at least.  Normal heart size and mediastinal contours. The lungs remain clear. No effusion or pneumothorax. Remote appearing lateral right clavicle fracture.  IMPRESSION: 1. New endotracheal and orogastric tubes are in good position. 2. Lungs remain clear.   Electronically Signed   By: Tiburcio Pea M.D.   On: 10/12/2013 00:56   Dg Chest Portable 1 View  10/11/2013   CLINICAL DATA:  Hyperglycemia.  Drug overdose.  EXAM: PORTABLE CHEST - 1 VIEW  COMPARISON:  07/01/2011  FINDINGS: Borderline cardiomegaly. Negative upper mediastinal contours. Mild hypoaeration with minimal left basilar atelectasis. There is no edema, consolidation, effusion, or pneumothorax. Cholecystectomy changes.  IMPRESSION: No active disease.   Electronically Signed   By: Tiburcio Pea M.D.   On: 10/11/2013 23:31   Dg Abd Portable 1v  10/14/2013   CLINICAL DATA:  Feeding tube placement at bedside.  EXAM: PORTABLE ABDOMEN - 1 VIEW  COMPARISON:  Portable abdomen x-ray 10/12/2013.  FINDINGS: Feeding tube tip projects over the distal body of the J shaped stomach. Visualized upper abdominal bowel gas pattern unremarkable. Large stool burden throughout the visualized colon.  IMPRESSION: 1. Feeding tube tip in the distal body of the stomach. 2. No acute abdominal abnormality.  Large stool burden.   Electronically Signed   By: Hulan Saas M.D.   On: 10/14/2013 16:29   Dg Abd Portable 1v  10/12/2013   CLINICAL DATA:   OGT placement  EXAM: PORTABLE ABDOMEN - 1 VIEW  COMPARISON:  02/23/2011  FINDINGS: Enteric tube terminates in the region of the duodenum bulb.  Nonobstructive bowel gas pattern.  Moderate colonic stool burden.  Cholecystectomy clips.  IMPRESSION: Enteric tube terminates in the region of the duodenum bulb.   Electronically Signed   By: Charline Bills M.D.   On: 10/12/2013 15:25       Consultations:  Palliative Care  Antibiotics:  None  Discharge Exam: Filed Vitals:   10/20/13 0527  BP: 146/72  Pulse: 71  Temp: 98.4 F (36.9 C)  Resp: 18   Filed Vitals:   10/19/13 0534 10/19/13 0935 10/19/13 2055 10/20/13 0527  BP: 128/74 135/72 150/63 146/72  Pulse: 82 87 78 71  Temp: 98.5 F (36.9 C) 98.2 F (36.8 C) 98.3 F (36.8 C) 98.4 F (36.9 C)  TempSrc: Oral Oral Oral Oral  Resp: 15 16 18 18   Height:      Weight:   114 lb 13.8 oz (52.1 kg)   SpO2: 94% 96% 94% 96%    General: Thin, Pt is alert, answers that her first name is Laylanie, does not follow commands consistently.  Cardiovascular: NR, RR, 2/6 systolic murmur  Respiratory: Course breath sounds throughout, but no distress. No increased work of breathing, no wheezing.  Abdomen: Soft, non tender, non distended, bowel sounds present Extremities: No edema  Neuro: lying comfortably in bed, mostly  non verbal.    Discharge Instructions     Medication List    STOP taking these medications       glyBURIDE 5 MG tablet  Commonly known as:  DIABETA     metFORMIN 500 MG tablet  Commonly known as:  GLUCOPHAGE     mirtazapine 7.5 MG tablet  Commonly known as:  REMERON     simvastatin 40 MG tablet  Commonly known as:  ZOCOR     Vitamin D (Ergocalciferol) 50000 UNITS Caps capsule  Commonly known as:  DRISDOL      TAKE these medications       antiseptic oral rinse Liqd  15 mLs by Mouth Rinse route QID.     bisacodyl 10 MG suppository  Commonly known as:  DULCOLAX  Place 1 suppository (10 mg total) rectally daily.      chlorhexidine 0.12 % solution  Commonly known as:  PERIDEX  15 mLs by Mouth Rinse route 2 (two) times daily.     docusate sodium 283 MG enema  Commonly known as:  ENEMEEZ  Place 1 enema (283 mg total) rectally daily as needed for moderate constipation or severe constipation.     glycopyrrolate 0.2 MG/ML injection  Commonly known as:  ROBINUL  Inject 0.5 mLs (0.1 mg total) into the vein 4 (four) times daily as needed (secretions).     haloperidol 2 MG/ML solution  Commonly known as:  HALDOL  Take 1 mL (2 mg total) by mouth every 6 (six) hours as needed for agitation.     LORazepam 0.5 MG tablet  Commonly known as:  ATIVAN  Take 1 tablet (0.5 mg total) by mouth at bedtime.     morphine CONCENTRATE 10 mg / 0.5 ml concentrated solution  Take 0.25 mLs (5 mg total) by mouth every 2 (two) hours as needed for moderate pain or shortness of breath.     scopolamine 1 MG/3DAYS  Commonly known as:  TRANSDERM-SCOP  Place 1 patch (1.5 mg total) onto the skin every 3 (three) days.          The results of significant diagnostics from this hospitalization (including imaging, microbiology, ancillary and laboratory) are listed below for reference.     Microbiology: Recent Results (from the past 240 hour(s))  MRSA PCR SCREENING     Status: None   Collection Time    10/12/13  3:54 AM      Result Value Ref Range Status   MRSA by PCR NEGATIVE  NEGATIVE Final   Comment:            The GeneXpert MRSA Assay (FDA     approved for NASAL specimens     only), is one component of a     comprehensive MRSA colonization     surveillance program. It is not     intended to diagnose MRSA     infection nor to guide or     monitor treatment for     MRSA infections.     Labs: Basic Metabolic Panel:  Recent Labs Lab 10/14/13 0446 10/15/13 0520  NA 138 145  K 3.9 4.0  CL 102 104  CO2 22 26  GLUCOSE 251* 117*  BUN 19 18  CREATININE 0.74 0.78  CALCIUM 8.7 9.0   CBC:  Recent Labs Lab  10/14/13 0446 10/15/13 0520  WBC 8.0 5.9  HGB 11.0* 12.0  HCT 33.1* 35.6*  MCV 98.5 99.7  PLT 186 205   Cardiac Enzymes: No results  found for this basename: CKTOTAL, CKMB, CKMBINDEX, TROPONINI,  in the last 168 hours BNP: BNP (last 3 results) No results found for this basename: PROBNP,  in the last 8760 hours CBG:  Recent Labs Lab 10/17/13 0046 10/17/13 0425 10/17/13 0800 10/17/13 1200 10/19/13 2107  GLUCAP 262* 249* 302* 334* 161*     SIGNED: Time coordinating discharge: 35 minutes  Chalet Kerwin, MD  [facility] 10/20/2013, 8:31 AM Pager [phone]  If 7PM-7AM, please contact night-coverage [email or web address] Password [email or web address]

## 2013-10-20 NOTE — Progress Notes (Signed)
Bed offer received this morning from Forrestine HimEva [facility], [facility] and bed [facility] by family.  [facility]. CSW completed d/c arrangements and notified patient's husband Sharlet SalinaBenjamin and "adopted" son Thayer OhmChris.  Nursing aware and called report. No further CSW needs; CSW signing off.  Lorri Frederickonna T. Jaci LazierCrowder, Kentucky[place]CS[place] [phone]

## 2013-10-20 NOTE — Progress Notes (Signed)
Patient is [facility] active.  Noted that patient is being considered for residential hospice.  Will continue to follow for confirmed disposition without further engagement.  However we will stand by to offer support as needed.  Thank you to care management or providing timely chaplain services upon admission to the family.  Of note, [facility] Care [facility] services does not replace or interfere with any services that are arranged by inpatient case management or social work.  For additional questions or referrals please contact Anibal Hendersonim Henderson BSN RN [facility] Liaison at [phone].

## 2013-11-17 ENCOUNTER — Telehealth: Payer: Self-pay | Admitting: Family Medicine

## 2013-11-17 NOTE — Telephone Encounter (Signed)
Yes.  Will be happy to follow.

## 2013-11-17 NOTE — Telephone Encounter (Signed)
Ask for Abby or Greer EeHeather Parrish [facility] would like to know if Dr.Burchette will continue to follow pt?? [facility] is [facility] and going home on hospice.

## 2013-11-18 NOTE — Telephone Encounter (Signed)
Left message for Abby and Heather per Dr. Caryl NeverBurchette.

## 2013-11-19 ENCOUNTER — Telehealth: Payer: Self-pay | Admitting: *Deleted

## 2013-11-19 ENCOUNTER — Telehealth: Payer: Self-pay | Admitting: Family Medicine

## 2013-11-19 MED ORDER — LORAZEPAM 0.5 MG PO TABS: 0.5000 mg | ORAL_TABLET | Freq: Every day | ORAL | Status: AC

## 2013-11-19 NOTE — Telephone Encounter (Signed)
Lorazepam  Last visit 10/06/13 Last refill 10/15/13 #30 0 refill

## 2013-11-19 NOTE — Telephone Encounter (Signed)
Pt has been constipated for a week. Colleen Collins would like a verbal order for the pt to receive an enema.

## 2013-11-19 NOTE — Telephone Encounter (Signed)
refill OK

## 2013-11-19 NOTE — Telephone Encounter (Signed)
Colleen Collins, home nurse with Hospice [phone](ph#[phone]) called stating the [facility] and the patient's husband was upset because she was discharged and declined all Rxs that were given which was Lorazepam, Morphine and Haldol.  Now the pt is home and is not sleeping at all and she requested Dr Caryl NeverBurchette send in Lorazepam 0.5mg -1 every 4 hours as needed for anxiety, restlessness and nausea to [facility] [address]

## 2013-11-19 NOTE — Telephone Encounter (Signed)
Rx called into the pharmacy. 

## 2013-11-19 NOTE — Addendum Note (Signed)
Addended by: Thomasena EdisFLOYD, Mishaal Lansdale E on: 11/19/2013 04:06 PM   Modules accepted: Orders

## 2013-11-20 NOTE — Telephone Encounter (Signed)
OK 

## 2013-11-20 NOTE — Telephone Encounter (Signed)
Left detailed message on [place] [facility]

## 2013-11-21 ENCOUNTER — Telehealth: Payer: Self-pay

## 2013-11-21 NOTE — Telephone Encounter (Signed)
Hospice informed foley cath. Okay per Dr. Caryl NeverBurchette.

## 2013-11-26 ENCOUNTER — Telehealth: Payer: Self-pay | Admitting: Family Medicine

## 2013-11-26 NOTE — Telephone Encounter (Signed)
Karel from Hospice called to notify you that pt expired on October 09, 2013 at 11:31 pm at home

## 2013-12-09 DEATH — deceased

## 2013-12-23 ENCOUNTER — Ambulatory Visit: Payer: Medicare Other | Admitting: Podiatry

## 2015-07-22 IMAGING — CR DG CHEST 1V PORT
1 series · 1 of 1 positions shown · non-contrast
Comparison: 07/01/2011

CLINICAL DATA: Hyperglycemia.  Drug overdose.

EXAM:
PORTABLE CHEST - 1 VIEW

[AP]
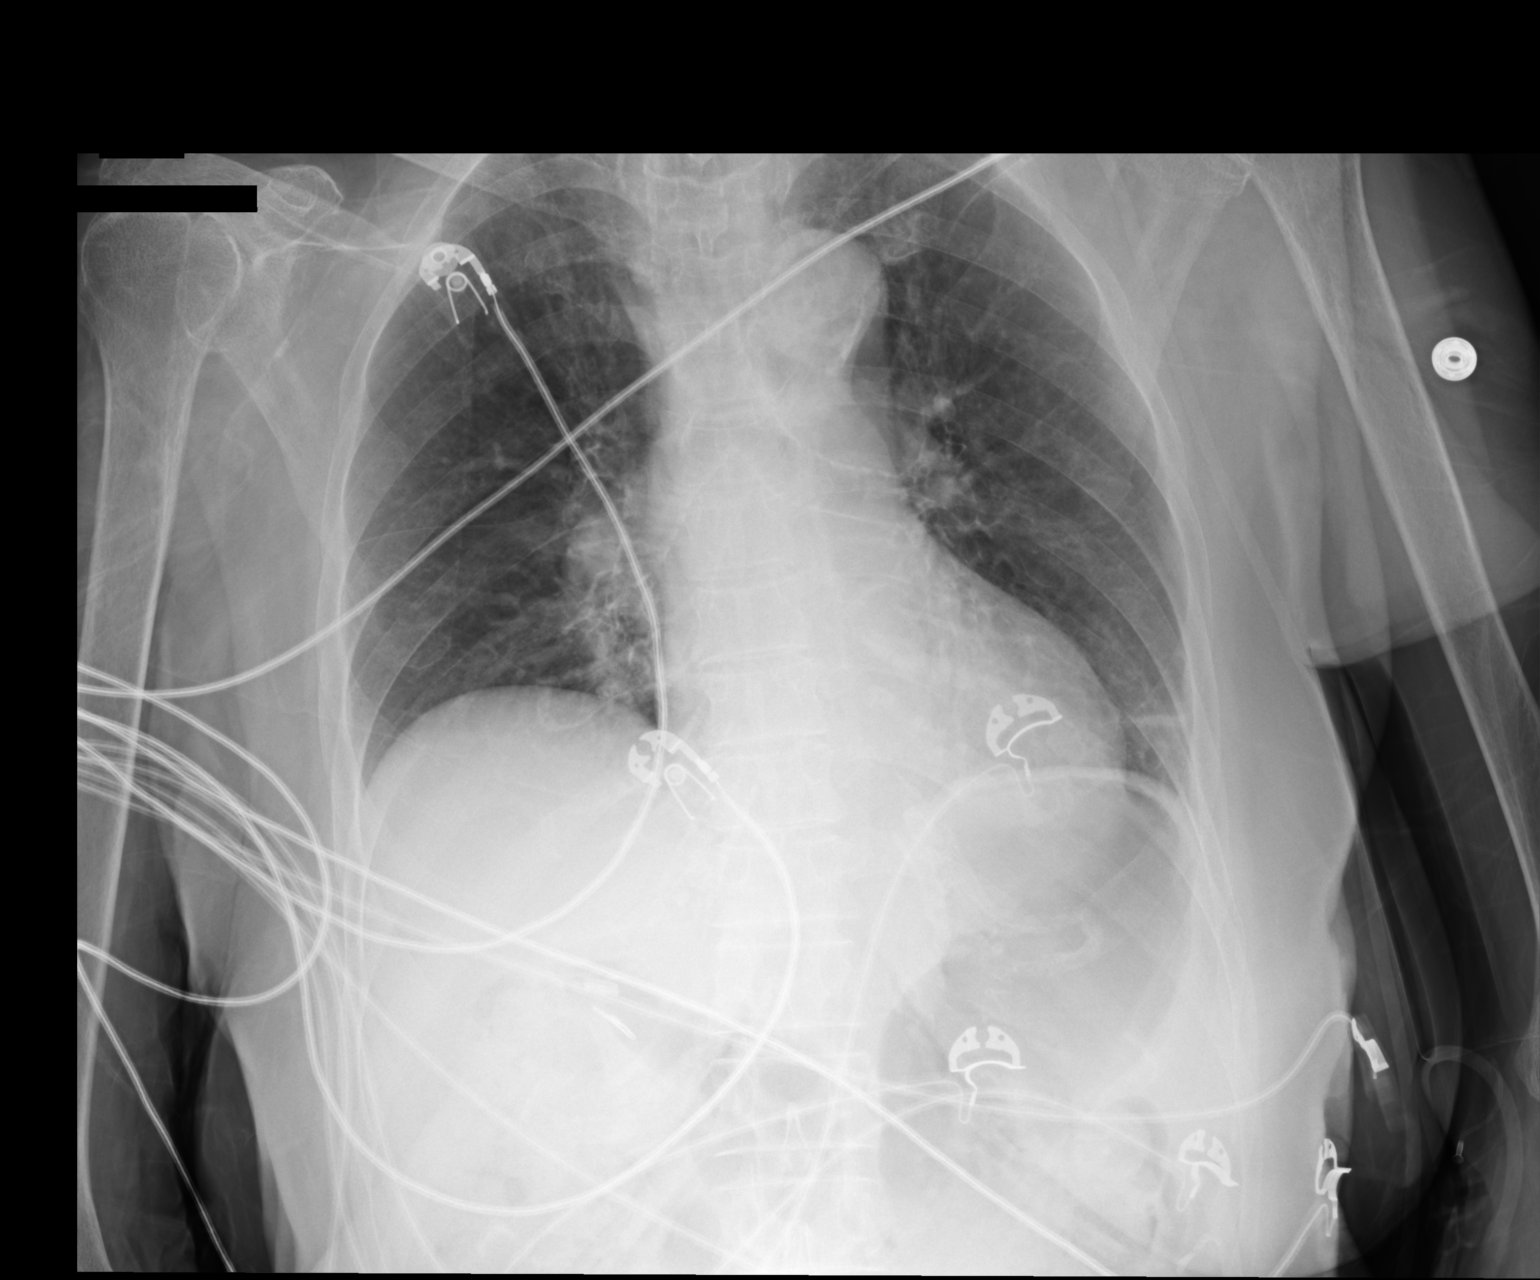

[1 of 1 positions shown; findings below may reference images not displayed]

FINDINGS: Borderline cardiomegaly. Negative upper mediastinal contours. Mild
hypoaeration with minimal left basilar atelectasis. There is no
edema, consolidation, effusion, or pneumothorax. Cholecystectomy
changes.
IMPRESSION: No active disease.

## 2015-07-23 IMAGING — CR DG ABD PORTABLE 1V
1 series · 1 of 1 positions shown · non-contrast
Comparison: 02/23/2011

CLINICAL DATA: OGT placement

EXAM:
PORTABLE ABDOMEN - 1 VIEW

[AP]
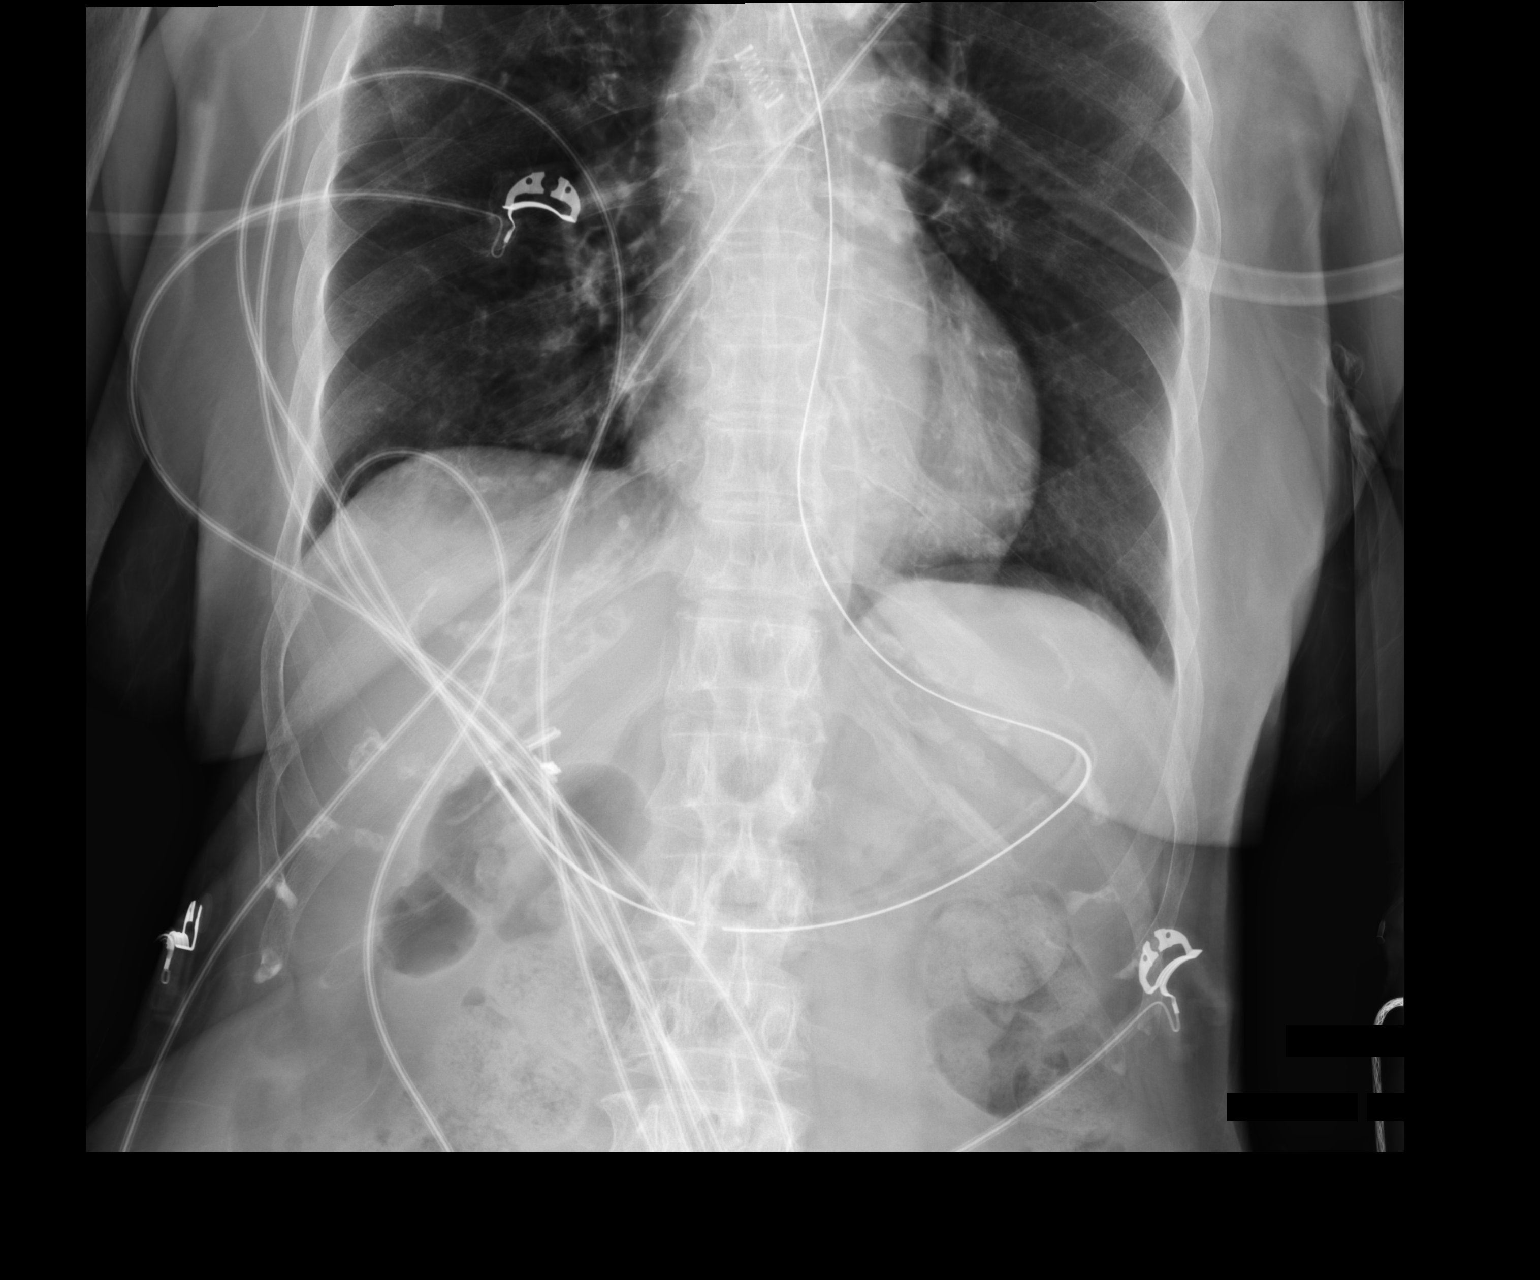

[1 of 1 positions shown; findings below may reference images not displayed]

FINDINGS: Enteric tube terminates in the region of the duodenum bulb.

Nonobstructive bowel gas pattern.

Moderate colonic stool burden.

Cholecystectomy clips.
IMPRESSION: Enteric tube terminates in the region of the duodenum bulb.

## 2015-07-23 IMAGING — CR DG CHEST 1V PORT
1 series · 1 of 1 positions shown · non-contrast
Comparison: Chest x-ray from yesterday

CLINICAL DATA: Endotracheal tube placed

EXAM:
PORTABLE CHEST - 1 VIEW

[AP]
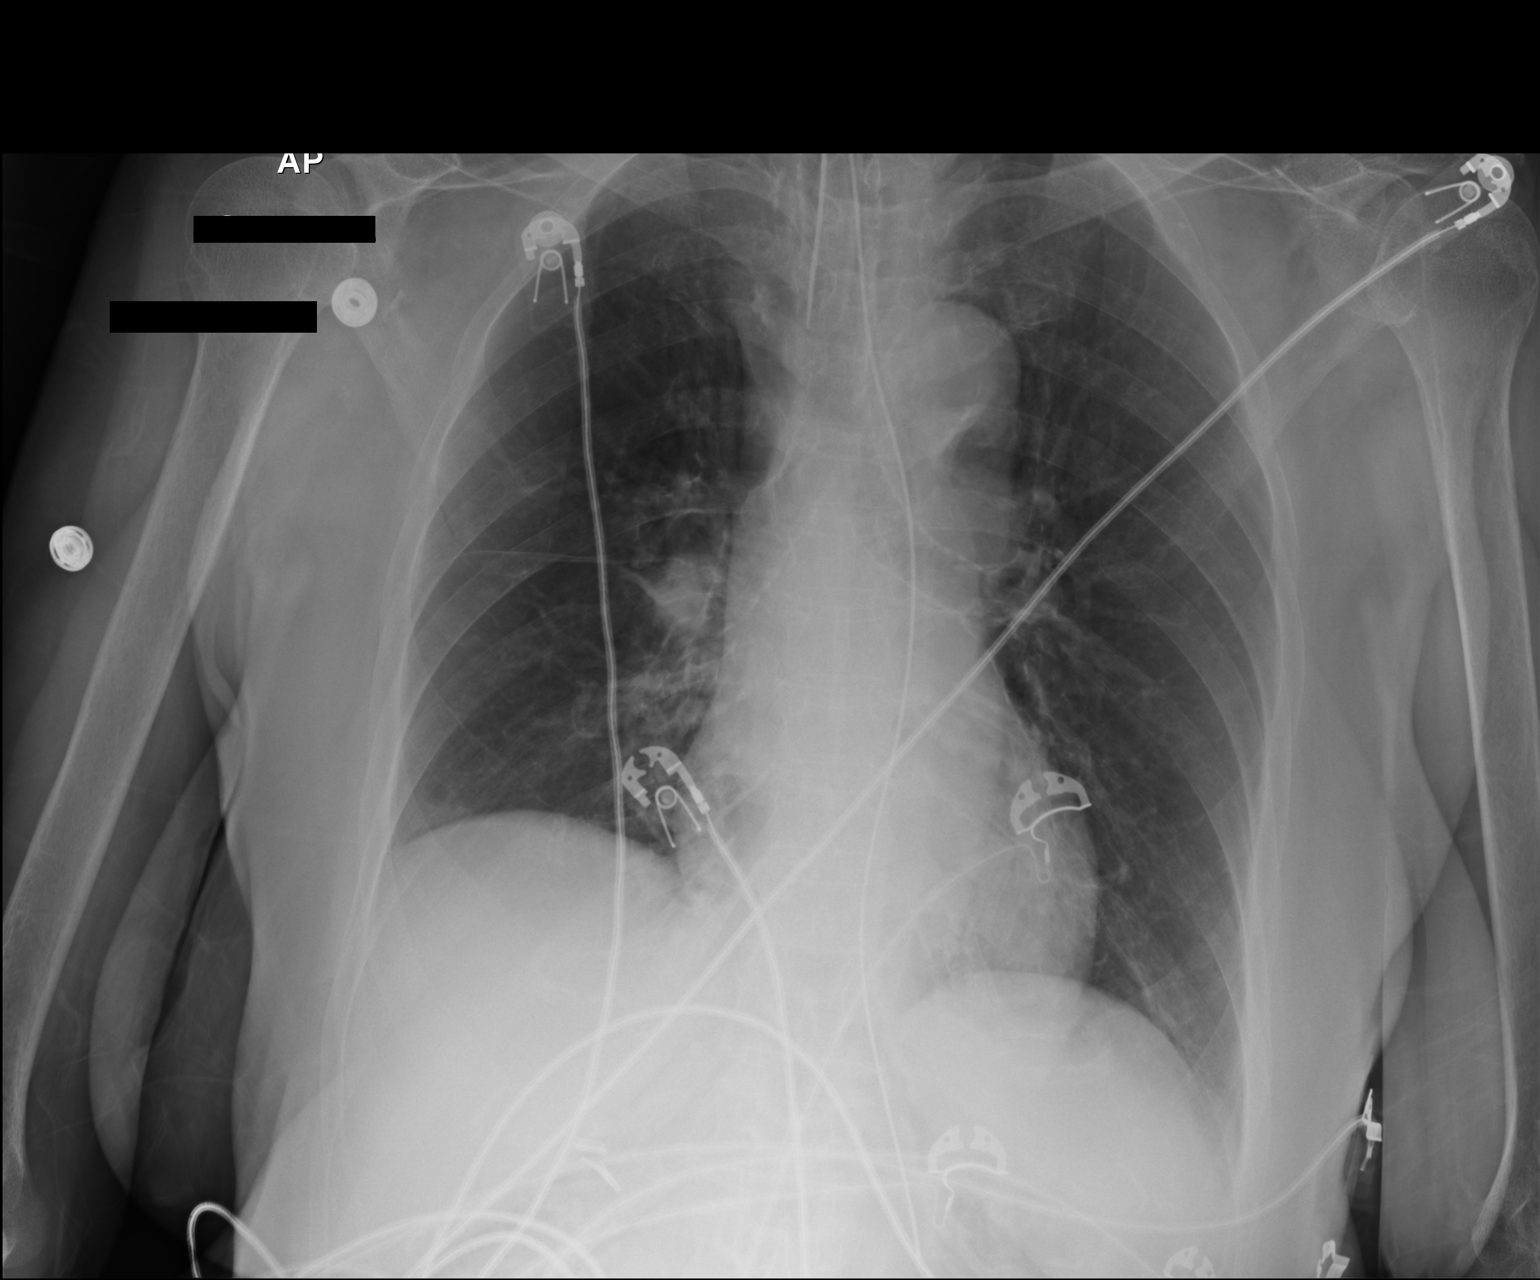

[1 of 1 positions shown; findings below may reference images not displayed]

FINDINGS: New endotracheal tube, tip at the level of the mid thoracic trachea.
A new orogastric tube enters the stomach at least.

Normal heart size and mediastinal contours. The lungs remain clear.
No effusion or pneumothorax. Remote appearing lateral right clavicle
fracture.
IMPRESSION: 1. New endotracheal and orogastric tubes are in good position.
2. Lungs remain clear.

## 2015-07-25 IMAGING — CR DG ABD PORTABLE 1V
1 series · 1 of 1 positions shown · non-contrast
Comparison: Portable abdomen x-ray 10/12/2013.

CLINICAL DATA: Feeding tube placement at bedside.

EXAM:
PORTABLE ABDOMEN - 1 VIEW

[supine ap]
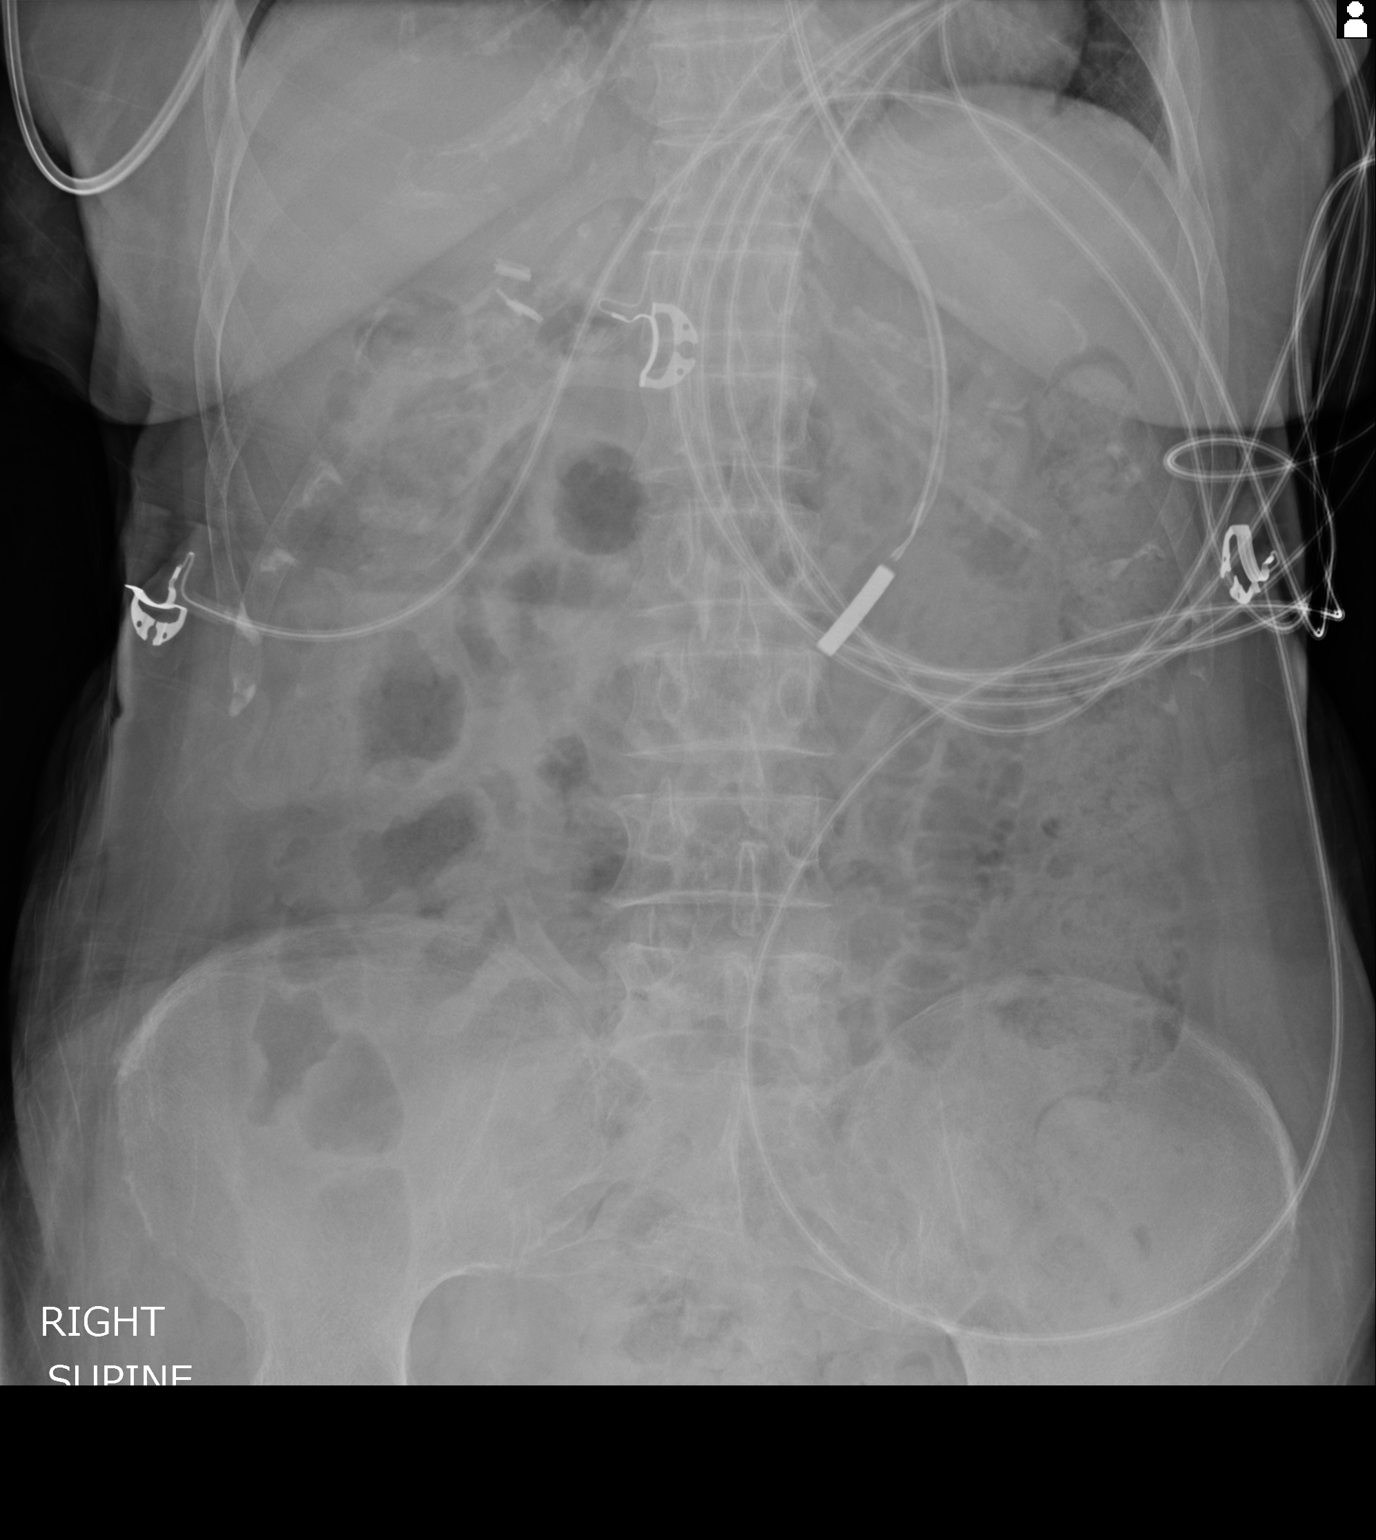

[1 of 1 positions shown; findings below may reference images not displayed]

FINDINGS: Feeding tube tip projects over the distal body of the J shaped
stomach. Visualized upper abdominal bowel gas pattern unremarkable.
Large stool burden throughout the visualized colon.
IMPRESSION: 1. Feeding tube tip in the distal body of the stomach.
2. No acute abdominal abnormality.  Large stool burden.
